# Patient Record
Sex: Male | Born: 1960 | Race: White | Hispanic: No | Marital: Married | Smoking: Current some day smoker
Health system: Southern US, Community
[De-identification: ages and names within clinical notes are randomized; demographics above are authoritative.]

## PROBLEM LIST (undated history)

## (undated) DIAGNOSIS — E785 Hyperlipidemia, unspecified: Secondary | ICD-10-CM

## (undated) DIAGNOSIS — I10 Essential (primary) hypertension: Secondary | ICD-10-CM

## (undated) DIAGNOSIS — F32A Depression, unspecified: Secondary | ICD-10-CM

## (undated) DIAGNOSIS — K5792 Diverticulitis of intestine, part unspecified, without perforation or abscess without bleeding: Secondary | ICD-10-CM

## (undated) DIAGNOSIS — F419 Anxiety disorder, unspecified: Secondary | ICD-10-CM

## (undated) DIAGNOSIS — R7303 Prediabetes: Secondary | ICD-10-CM

## (undated) DIAGNOSIS — B029 Zoster without complications: Secondary | ICD-10-CM

## (undated) DIAGNOSIS — M1990 Unspecified osteoarthritis, unspecified site: Secondary | ICD-10-CM

## (undated) DIAGNOSIS — N183 Chronic kidney disease, stage 3 unspecified: Secondary | ICD-10-CM

## (undated) DIAGNOSIS — K219 Gastro-esophageal reflux disease without esophagitis: Secondary | ICD-10-CM

## (undated) DIAGNOSIS — T7840XA Allergy, unspecified, initial encounter: Secondary | ICD-10-CM

## (undated) DIAGNOSIS — M5416 Radiculopathy, lumbar region: Secondary | ICD-10-CM

## (undated) DIAGNOSIS — G8929 Other chronic pain: Secondary | ICD-10-CM

## (undated) HISTORY — PX: BACK SURGERY: SHX140

## (undated) HISTORY — DX: Depression, unspecified: F32.A

## (undated) HISTORY — DX: Allergy, unspecified, initial encounter: T78.40XA

## (undated) HISTORY — PX: HERNIA REPAIR: SHX51

## (undated) HISTORY — DX: Gastro-esophageal reflux disease without esophagitis: K21.9

## (undated) HISTORY — DX: Unspecified osteoarthritis, unspecified site: M19.90

## (undated) HISTORY — PX: WISDOM TOOTH EXTRACTION: SHX21

## (undated) HISTORY — DX: Anxiety disorder, unspecified: F41.9

---

## 1984-07-05 DIAGNOSIS — H9319 Tinnitus, unspecified ear: Secondary | ICD-10-CM | POA: Insufficient documentation

## 2013-04-12 ENCOUNTER — Ambulatory Visit (HOSPITAL_COMMUNITY)
Admission: RE | Admit: 2013-04-12 | Discharge: 2013-04-12 | Disposition: A | Discharge status: Home / Self Care | Payer: Non-veteran care | Source: Ambulatory Visit | Attending: Internal Medicine | Admitting: Internal Medicine

## 2013-04-12 ENCOUNTER — Other Ambulatory Visit (HOSPITAL_COMMUNITY): Payer: Self-pay | Admitting: Internal Medicine

## 2013-04-12 DIAGNOSIS — M1990 Unspecified osteoarthritis, unspecified site: Secondary | ICD-10-CM

## 2013-04-12 DIAGNOSIS — M25469 Effusion, unspecified knee: Secondary | ICD-10-CM | POA: Insufficient documentation

## 2013-04-12 DIAGNOSIS — M25569 Pain in unspecified knee: Secondary | ICD-10-CM | POA: Insufficient documentation

## 2015-12-07 ENCOUNTER — Encounter (HOSPITAL_COMMUNITY): Payer: Self-pay | Admitting: Emergency Medicine

## 2015-12-07 ENCOUNTER — Emergency Department (HOSPITAL_COMMUNITY)
Admission: EM | Admit: 2015-12-07 | Discharge: 2015-12-07 | Disposition: A | Discharge status: Home / Self Care | Payer: Non-veteran care | Attending: Emergency Medicine | Admitting: Emergency Medicine

## 2015-12-07 DIAGNOSIS — I10 Essential (primary) hypertension: Secondary | ICD-10-CM | POA: Diagnosis not present

## 2015-12-07 DIAGNOSIS — Z791 Long term (current) use of non-steroidal anti-inflammatories (NSAID): Secondary | ICD-10-CM | POA: Insufficient documentation

## 2015-12-07 DIAGNOSIS — Z79899 Other long term (current) drug therapy: Secondary | ICD-10-CM | POA: Diagnosis not present

## 2015-12-07 DIAGNOSIS — R109 Unspecified abdominal pain: Secondary | ICD-10-CM | POA: Diagnosis not present

## 2015-12-07 DIAGNOSIS — N390 Urinary tract infection, site not specified: Secondary | ICD-10-CM | POA: Diagnosis not present

## 2015-12-07 DIAGNOSIS — M549 Dorsalgia, unspecified: Secondary | ICD-10-CM | POA: Diagnosis not present

## 2015-12-07 DIAGNOSIS — R300 Dysuria: Secondary | ICD-10-CM | POA: Diagnosis present

## 2015-12-07 HISTORY — DX: Essential (primary) hypertension: I10

## 2015-12-07 LAB — URINE MICROSCOPIC-ADD ON

## 2015-12-07 LAB — URINALYSIS, ROUTINE W REFLEX MICROSCOPIC
Bilirubin Urine: NEGATIVE
Glucose, UA: NEGATIVE mg/dL
Hgb urine dipstick: NEGATIVE
Ketones, ur: NEGATIVE mg/dL
Leukocytes, UA: NEGATIVE
Nitrite: POSITIVE — AB
Protein, ur: 30 mg/dL — AB
Specific Gravity, Urine: 1.01 (ref 1.005–1.030)
pH: 5.5 (ref 5.0–8.0)

## 2015-12-07 MED ORDER — HYDROCODONE-ACETAMINOPHEN 5-325 MG PO TABS: 1.0000 | ORAL_TABLET | Freq: Four times a day (QID) | ORAL | Status: DC | PRN

## 2015-12-07 MED ORDER — KETOROLAC TROMETHAMINE 60 MG/2ML IM SOLN
60.0000 mg | Freq: Once | INTRAMUSCULAR | Status: AC
  Administered 2015-12-07: 60 mg via INTRAMUSCULAR
  Filled 2015-12-07: qty 2

## 2015-12-07 MED ORDER — HYDROCODONE-ACETAMINOPHEN 5-325 MG PO TABS
2.0000 | ORAL_TABLET | Freq: Once | ORAL | Status: AC
  Administered 2015-12-07: 2 via ORAL
  Filled 2015-12-07: qty 2

## 2015-12-07 MED ORDER — ONDANSETRON 4 MG PO TBDP
4.0000 mg | ORAL_TABLET | Freq: Once | ORAL | Status: AC
  Administered 2015-12-07: 4 mg via ORAL
  Filled 2015-12-07: qty 1

## 2015-12-07 MED ORDER — ONDANSETRON 4 MG PO TBDP: 4.0000 mg | ORAL_TABLET | Freq: Three times a day (TID) | ORAL | Status: DC | PRN

## 2015-12-07 MED ORDER — CIPROFLOXACIN HCL 500 MG PO TABS: 500.0000 mg | ORAL_TABLET | Freq: Two times a day (BID) | ORAL | Status: DC

## 2015-12-07 MED ORDER — IBUPROFEN 800 MG PO TABS: 800.0000 mg | ORAL_TABLET | Freq: Three times a day (TID) | ORAL | Status: DC

## 2015-12-07 MED ORDER — CIPROFLOXACIN HCL 250 MG PO TABS
500.0000 mg | ORAL_TABLET | Freq: Once | ORAL | Status: AC
  Administered 2015-12-07: 500 mg via ORAL
  Filled 2015-12-07: qty 2

## 2015-12-07 NOTE — ED Notes (Signed)
Patient verbalizes understanding of discharge instructions prescriptions home care and follow up care. Patient out of department at this time.

## 2015-12-07 NOTE — ED Provider Notes (Signed)
CSN: [id number]     Arrival date & time 12/07/15  1638 History   First MD Initiated Contact with Patient 12/07/15 1719     Chief Complaint  Patient presents with  . Dysuria     (Consider location/radiation/quality/duration/timing/severity/associated sxs/prior Treatment) Patient is a 55 y.o. male presenting with dysuria.  Dysuria This is a new problem. The current episode started more than 2 days ago. The problem occurs constantly. Associated symptoms include abdominal pain (suprapubic). Pertinent negatives include no chest pain, no headaches and no shortness of breath. Nothing aggravates the symptoms. Nothing relieves the symptoms. He has tried nothing for the symptoms. The treatment provided no relief.    Past Medical History  Diagnosis Date  . Hypertension    Past Surgical History  Procedure Laterality Date  . Hernia repair     History reviewed. No pertinent family history. Social History  Substance Use Topics  . Smoking status: Never Smoker   . Smokeless tobacco: None  . Alcohol Use: No    Review of Systems  Constitutional: Negative for fever and chills.  Respiratory: Negative for shortness of breath.   Cardiovascular: Negative for chest pain.  Gastrointestinal: Positive for abdominal pain (suprapubic).  Genitourinary: Positive for dysuria.  Musculoskeletal: Positive for back pain (bilateral lwoer back).  Neurological: Negative for headaches.  All other systems reviewed and are negative.     Allergies  Sulfa antibiotics  Home Medications   Prior to Admission medications   Medication Sig Start Date End Date Taking? Authorizing Provider  diphenhydrAMINE (BENADRYL) 25 MG tablet Take 25 mg by mouth every 6 (six) hours as needed for allergies.   Yes Historical Provider, MD  lisinopril (PRINIVIL,ZESTRIL) 10 MG tablet Take 10 mg by mouth daily.   Yes Historical Provider, MD  ciprofloxacin (CIPRO) 500 MG tablet Take 1 tablet (500 mg total) by mouth 2 (two) times daily.  12/07/15   Marily MemosJason Lateshia Schmoker, MD  HYDROcodone-acetaminophen (NORCO/VICODIN) 5-325 MG tablet Take 1 tablet by mouth every 6 (six) hours as needed for severe pain. 12/07/15   Marily MemosJason Shankar Silber, MD  ibuprofen (ADVIL,MOTRIN) 800 MG tablet Take 1 tablet (800 mg total) by mouth 3 (three) times daily. 12/07/15   Marily MemosJason Kamiryn Bezanson, MD  ondansetron (ZOFRAN-ODT) 4 MG disintegrating tablet Take 1 tablet (4 mg total) by mouth every 8 (eight) hours as needed for nausea or vomiting. 12/07/15   Marily MemosJason Catherine Cubero, MD   BP 143/92 mmHg  Pulse 77  Temp(Src) 98 F (36.7 C) (Oral)  Resp 18  Ht 5\' 11"  (1.803 m)  Wt 195 lb (88.451 kg)  BMI 27.21 kg/m2  SpO2 98% Physical Exam  Constitutional: He appears well-developed and well-nourished.  HENT:  Head: Normocephalic and atraumatic.  Neck: Normal range of motion.  Cardiovascular: Normal rate.   Pulmonary/Chest: Effort normal. No respiratory distress. He has no wheezes.  Abdominal: He exhibits no distension. There is tenderness (suprapubic). There is no rebound and no guarding.  Musculoskeletal: Normal range of motion. He exhibits no edema or tenderness.  Neurological: He is alert.  Skin: Skin is warm and dry. No rash noted.  Nursing note and vitals reviewed.   ED Course  Procedures (including critical care time) Labs Review Labs Reviewed  URINALYSIS, ROUTINE W REFLEX MICROSCOPIC (NOT [facility]) - Abnormal; Notable for the following:    Color, Urine AMBER (*)    Protein, ur 30 (*)    Nitrite POSITIVE (*)    All other components within normal limits  URINE MICROSCOPIC-ADD ON - Abnormal; Notable  for the following:    Squamous Epithelial / LPF 0-5 (*)    Bacteria, UA RARE (*)    All other components within normal limits  URINE CULTURE    Imaging Review No results found. I have personally reviewed and evaluated these images and lab results as part of my medical decision-making.   EKG Interpretation None      MDM   Final diagnoses:  UTI (lower urinary tract infection)     UA with likely UTI. Culture added. Suspect early pyelo based on back pain and nausea. No e/o sepsis to need further workup. Will dc on cipro/zofran.   New Prescriptions: Discharge Medication List as of 12/07/2015  5:55 PM    START taking these medications   Details  ciprofloxacin (CIPRO) 500 MG tablet Take 1 tablet (500 mg total) by mouth 2 (two) times daily., Starting 12/07/2015, Until Discontinued, Print    HYDROcodone-acetaminophen (NORCO/VICODIN) 5-325 MG tablet Take 1 tablet by mouth every 6 (six) hours as needed for severe pain., Starting 12/07/2015, Until Discontinued, Print    ondansetron (ZOFRAN-ODT) 4 MG disintegrating tablet Take 1 tablet (4 mg total) by mouth every 8 (eight) hours as needed for nausea or vomiting., Starting 12/07/2015, Until Discontinued, Print        I have personally and contemperaneously reviewed labs and imaging and used in my decision making as above.   A medical screening exam was performed and I feel the patient has had an appropriate workup for their chief complaint at this time and likelihood of emergent condition existing is low and thus workup can continue on an outpatient basis.. Their vital signs are stable. They have been counseled on decision, discharge, follow up and which symptoms necessitate immediate return to the emergency department.  They verbally stated understanding and agreement with plan and discharged in stable condition.      Marily Memos, MD 12/07/15 [phone]

## 2015-12-07 NOTE — ED Notes (Signed)
Pt states he has been having a lot of pain in his bladder for the past few days with burning with urination.

## 2015-12-09 LAB — URINE CULTURE: Culture: NO GROWTH

## 2017-06-06 ENCOUNTER — Encounter (HOSPITAL_COMMUNITY): Payer: Self-pay | Admitting: Cardiology

## 2017-06-06 ENCOUNTER — Emergency Department (HOSPITAL_COMMUNITY)
Admission: EM | Admit: 2017-06-06 | Discharge: 2017-06-06 | Disposition: A | Discharge status: Home / Self Care | Payer: Non-veteran care | Attending: Emergency Medicine | Admitting: Emergency Medicine

## 2017-06-06 ENCOUNTER — Emergency Department (HOSPITAL_COMMUNITY): Payer: Non-veteran care

## 2017-06-06 DIAGNOSIS — K5732 Diverticulitis of large intestine without perforation or abscess without bleeding: Secondary | ICD-10-CM | POA: Diagnosis not present

## 2017-06-06 DIAGNOSIS — I10 Essential (primary) hypertension: Secondary | ICD-10-CM | POA: Diagnosis not present

## 2017-06-06 DIAGNOSIS — Z79899 Other long term (current) drug therapy: Secondary | ICD-10-CM | POA: Insufficient documentation

## 2017-06-06 DIAGNOSIS — R1032 Left lower quadrant pain: Secondary | ICD-10-CM | POA: Diagnosis present

## 2017-06-06 HISTORY — DX: Diverticulitis of intestine, part unspecified, without perforation or abscess without bleeding: K57.92

## 2017-06-06 LAB — DIFFERENTIAL
BASOS PCT: 0 %
Basophils Absolute: 0 10*3/uL (ref 0.0–0.1)
Eosinophils Absolute: 0.1 10*3/uL (ref 0.0–0.7)
Eosinophils Relative: 1 %
Lymphocytes Relative: 17 %
Lymphs Abs: 2.2 10*3/uL (ref 0.7–4.0)
Monocytes Absolute: 1.1 10*3/uL — ABNORMAL HIGH (ref 0.1–1.0)
Monocytes Relative: 9 %
NEUTROS ABS: 9.1 10*3/uL — AB (ref 1.7–7.7)
NEUTROS PCT: 73 %

## 2017-06-06 LAB — URINALYSIS, ROUTINE W REFLEX MICROSCOPIC
Bilirubin Urine: NEGATIVE
Glucose, UA: NEGATIVE mg/dL
Hgb urine dipstick: NEGATIVE
KETONES UR: NEGATIVE mg/dL
LEUKOCYTES UA: NEGATIVE
NITRITE: NEGATIVE
PROTEIN: NEGATIVE mg/dL
Specific Gravity, Urine: 1.009 (ref 1.005–1.030)
pH: 6 (ref 5.0–8.0)

## 2017-06-06 LAB — CBC
HEMATOCRIT: 44.8 % (ref 39.0–52.0)
Hemoglobin: 15.2 g/dL (ref 13.0–17.0)
MCH: 31.7 pg (ref 26.0–34.0)
MCHC: 33.9 g/dL (ref 30.0–36.0)
MCV: 93.5 fL (ref 78.0–100.0)
Platelets: 312 10*3/uL (ref 150–400)
RBC: 4.79 MIL/uL (ref 4.22–5.81)
RDW: 12.9 % (ref 11.5–15.5)
WBC: 12.7 10*3/uL — ABNORMAL HIGH (ref 4.0–10.5)

## 2017-06-06 LAB — COMPREHENSIVE METABOLIC PANEL
ALBUMIN: 4.7 g/dL (ref 3.5–5.0)
ALT: 41 U/L (ref 17–63)
AST: 33 U/L (ref 15–41)
Alkaline Phosphatase: 50 U/L (ref 38–126)
Anion gap: 11 (ref 5–15)
BUN: 9 mg/dL (ref 6–20)
CO2: 24 mmol/L (ref 22–32)
CREATININE: 1.11 mg/dL (ref 0.61–1.24)
Calcium: 10.1 mg/dL (ref 8.9–10.3)
Chloride: 105 mmol/L (ref 101–111)
GFR calc Af Amer: >60 mL/min (ref >60)
GFR calc non Af Amer: >60 mL/min (ref >60)
Glucose, Bld: 105 mg/dL — ABNORMAL HIGH (ref 65–99)
POTASSIUM: 4.6 mmol/L (ref 3.5–5.1)
SODIUM: 140 mmol/L (ref 135–145)
TOTAL PROTEIN: 7.8 g/dL (ref 6.5–8.1)
Total Bilirubin: 0.8 mg/dL (ref 0.3–1.2)

## 2017-06-06 LAB — LIPASE, BLOOD: LIPASE: 29 U/L (ref 11–51)

## 2017-06-06 MED ORDER — OXYCODONE-ACETAMINOPHEN 5-325 MG PO TABS
2.0000 | ORAL_TABLET | Freq: Once | ORAL | Status: AC
  Administered 2017-06-06: 2 via ORAL
  Filled 2017-06-06: qty 2

## 2017-06-06 MED ORDER — METRONIDAZOLE 500 MG PO TABS: 500.0000 mg | ORAL_TABLET | Freq: Three times a day (TID) | ORAL | 0 refills | Status: DC

## 2017-06-06 MED ORDER — CIPROFLOXACIN HCL 500 MG PO TABS: 500.0000 mg | ORAL_TABLET | Freq: Two times a day (BID) | ORAL | 0 refills | Status: DC

## 2017-06-06 MED ORDER — METRONIDAZOLE IN NACL 5-0.79 MG/ML-% IV SOLN
500.0000 mg | Freq: Once | INTRAVENOUS | Status: AC
  Administered 2017-06-06: 500 mg via INTRAVENOUS
  Filled 2017-06-06: qty 100

## 2017-06-06 MED ORDER — OXYCODONE-ACETAMINOPHEN 5-325 MG PO TABS: ORAL_TABLET | ORAL | 0 refills | Status: DC

## 2017-06-06 MED ORDER — MORPHINE SULFATE (PF) 4 MG/ML IV SOLN
4.0000 mg | INTRAVENOUS | Status: AC | PRN
  Administered 2017-06-06 (×2): 4 mg via INTRAVENOUS
  Filled 2017-06-06 (×2): qty 1

## 2017-06-06 MED ORDER — CIPROFLOXACIN IN D5W 400 MG/200ML IV SOLN
400.0000 mg | Freq: Once | INTRAVENOUS | Status: AC
  Administered 2017-06-06: 400 mg via INTRAVENOUS
  Filled 2017-06-06: qty 200

## 2017-06-06 MED ORDER — PROMETHAZINE HCL 25 MG PO TABS: 25.0000 mg | ORAL_TABLET | Freq: Four times a day (QID) | ORAL | 0 refills | Status: DC | PRN

## 2017-06-06 MED ORDER — SODIUM CHLORIDE 0.9 % IV SOLN
INTRAVENOUS | Status: DC
  Administered 2017-06-06: 19:00:00 via INTRAVENOUS

## 2017-06-06 MED ORDER — IOPAMIDOL (ISOVUE-300) INJECTION 61%
100.0000 mL | Freq: Once | INTRAVENOUS | Status: AC | PRN
  Administered 2017-06-06: 100 mL via INTRAVENOUS

## 2017-06-06 MED ORDER — ONDANSETRON HCL 4 MG/2ML IJ SOLN
4.0000 mg | INTRAMUSCULAR | Status: DC | PRN
  Administered 2017-06-06: 4 mg via INTRAVENOUS
  Filled 2017-06-06: qty 2

## 2017-06-06 NOTE — Discharge Instructions (Signed)
Take the prescriptions as directed.  Eat a clear liquid diet for the next few days, then advance to a soft diet as slowly as you can tolerate it. Call your regular medical doctor tomorrow to schedule a follow up appointment in the next 2 days. Call the GI doctor tomorrow to schedule a follow up appointment within the next week.  Return to the Emergency Department immediately if not improving (or even worsening) despite taking the medicines as prescribed, any black or bloody stool or vomit, if you develop a fever over "101," or for any other concerns.

## 2017-06-06 NOTE — ED Triage Notes (Signed)
Left sided abdominal pain since last night.  C/O nausea.

## 2017-06-06 NOTE — ED Provider Notes (Signed)
[facility] Provider Note   CSN: [id number] Arrival date & time: 06/06/17  1357     History   Chief Complaint Chief Complaint  Patient presents with  . Abdominal Pain    HPI Thalia PartyCharles Buffone is a 56 y.o. male.   Abdominal Pain      Pt was seen at 1825.  Per pt, c/o gradual onset and persistence of constant LLQ abd "pain" for the past 2 days.  Has been associated with nausea and multiple intermittent episodes of diarrhea.  Describes the abd pain as "like the last time I had diverticulitis."  Denies vomiting, no fevers, no back pain, no rash, no CP/SOB, no black or blood in stools.      Past Medical History:  Diagnosis Date  . Diverticulitis   . Hypertension     There are no active problems to display for this patient.   Past Surgical History:  Procedure Laterality Date  . HERNIA REPAIR         Home Medications    Prior to Admission medications   Medication Sig Start Date End Date Taking? Authorizing Provider  ciprofloxacin (CIPRO) 500 MG tablet Take 1 tablet (500 mg total) by mouth 2 (two) times daily. 12/07/15   Mesner, Barbara CowerJason, MD  diphenhydrAMINE (BENADRYL) 25 MG tablet Take 25 mg by mouth every 6 (six) hours as needed for allergies.    [provider]  HYDROcodone-acetaminophen (NORCO/VICODIN) 5-325 MG tablet Take 1 tablet by mouth every 6 (six) hours as needed for severe pain. 12/07/15   Mesner, Barbara CowerJason, MD  ibuprofen (ADVIL,MOTRIN) 800 MG tablet Take 1 tablet (800 mg total) by mouth 3 (three) times daily. 12/07/15   Mesner, Barbara CowerJason, MD  lisinopril (PRINIVIL,ZESTRIL) 10 MG tablet Take 10 mg by mouth daily.    [provider]  ondansetron (ZOFRAN-ODT) 4 MG disintegrating tablet Take 1 tablet (4 mg total) by mouth every 8 (eight) hours as needed for nausea or vomiting. 12/07/15   Mesner, Barbara CowerJason, MD    Family History History reviewed. No pertinent family history.  Social History Social History   Tobacco Use  . Smoking status: Never  Smoker  Substance Use Topics  . Alcohol use: No  . Drug use: No     Allergies   Sulfa antibiotics   Review of Systems Review of Systems  Gastrointestinal: Positive for abdominal pain.  ROS: Statement: All systems negative except as marked or noted in the HPI; Constitutional: Negative for fever and chills. ; ; Eyes: Negative for eye pain, redness and discharge. ; ; ENMT: Negative for ear pain, hoarseness, nasal congestion, sinus pressure and sore throat. ; ; Cardiovascular: Negative for chest pain, palpitations, diaphoresis, dyspnea and peripheral edema. ; ; Respiratory: Negative for cough, wheezing and stridor. ; ; Gastrointestinal: +nausea, abd pain. Negative for vomiting, blood in stool, hematemesis, jaundice and rectal bleeding. . ; ; Genitourinary: Negative for dysuria, flank pain and hematuria. ; ; Musculoskeletal: Negative for back pain and neck pain. Negative for swelling and trauma.; ; Skin: Negative for pruritus, rash, abrasions, blisters, bruising and skin lesion.; ; Neuro: Negative for headache, lightheadedness and neck stiffness. Negative for weakness, altered level of consciousness, altered mental status, extremity weakness, paresthesias, involuntary movement, seizure and syncope.        Physical Exam Updated Vital Signs BP 106/81 (BP Location: Right Arm)   Pulse (!) 119   Temp 98.2 F (36.8 C) (Oral)   Resp 18   Ht 5\' 11"  (1.803 m)  Wt 88.5 kg (195 lb)   SpO2 94%   BMI 27.20 kg/m   Physical Exam 1830: Physical examination:  Nursing notes reviewed; Vital signs and O2 SAT reviewed;  Constitutional: Well developed, Well nourished, Well hydrated, Uncomfortable appearing.; Head:  Normocephalic, atraumatic; Eyes: EOMI, PERRL, No scleral icterus; ENMT: Mouth and pharynx normal, Mucous membranes moist; Neck: Supple, Full range of motion, No lymphadenopathy; Cardiovascular: Tachycardic rate and rhythm, No gallop; Respiratory: Breath sounds clear & equal bilaterally, No  wheezes.  Speaking full sentences with ease, Normal respiratory effort/excursion; Chest: Nontender, Movement normal; Abdomen: Soft, +LLQ > diffuse tenderness to palp. Nondistended, Normal bowel sounds; Genitourinary: No CVA tenderness; Extremities: Pulses normal, No tenderness, No edema, No calf edema or asymmetry.; Neuro: AA&Ox3, Major CN grossly intact.  Speech clear. No gross focal motor or sensory deficits in extremities.; Skin: Color normal, Warm, Dry.   ED Treatments / Results  Labs (all labs ordered are listed, but only abnormal results are displayed)   EKG  EKG Interpretation None       Radiology    Procedures Procedures (including critical care time)  Medications Ordered in ED Medications  morphine 4 MG/ML injection 4 mg (not administered)  ondansetron (ZOFRAN) injection 4 mg (not administered)     Initial Impression / Assessment and Plan / ED Course  I have reviewed the triage vital signs and the nursing notes.  Pertinent labs & imaging results that were available during my care of the patient were reviewed by me and considered in my medical decision making (see chart for details).  MDM Reviewed: nursing note and vitals Interpretation: labs and CT scan   Results for orders placed or performed during the hospital encounter of 06/06/17  Lipase, blood  Result Value Ref Range   Lipase 29 11 - 51 U/L  Comprehensive metabolic panel  Result Value Ref Range   Sodium 140 135 - 145 mmol/L   Potassium 4.6 3.5 - 5.1 mmol/L   Chloride 105 101 - 111 mmol/L   CO2 24 22 - 32 mmol/L   Glucose, Bld 105 (H) 65 - 99 mg/dL   BUN 9 6 - 20 mg/dL   Creatinine, Ser 1.61 0.61 - 1.24 mg/dL   Calcium 09.6 8.9 - 04.5 mg/dL   Total Protein 7.8 6.5 - 8.1 g/dL   Albumin 4.7 3.5 - 5.0 g/dL   AST 33 15 - 41 U/L   ALT 41 17 - 63 U/L   Alkaline Phosphatase 50 38 - 126 U/L   Total Bilirubin 0.8 0.3 - 1.2 mg/dL   GFR calc non Af Amer >60 >60 mL/min   GFR calc Af Amer >60 >60 mL/min    Anion gap 11 5 - 15  CBC  Result Value Ref Range   WBC 12.7 (H) 4.0 - 10.5 K/uL   RBC 4.79 4.22 - 5.81 MIL/uL   Hemoglobin 15.2 13.0 - 17.0 g/dL   HCT 40.9 81.1 - 91.4 %   MCV 93.5 78.0 - 100.0 fL   MCH 31.7 26.0 - 34.0 pg   MCHC 33.9 30.0 - 36.0 g/dL   RDW 78.2 95.6 - 21.3 %   Platelets 312 150 - 400 K/uL  Urinalysis, Routine w reflex microscopic  Result Value Ref Range   Color, Urine YELLOW YELLOW   APPearance CLEAR CLEAR   Specific Gravity, Urine 1.009 1.005 - 1.030   pH 6.0 5.0 - 8.0   Glucose, UA NEGATIVE NEGATIVE mg/dL   Hgb urine dipstick NEGATIVE NEGATIVE   Bilirubin  Urine NEGATIVE NEGATIVE   Ketones, ur NEGATIVE NEGATIVE mg/dL   Protein, ur NEGATIVE NEGATIVE mg/dL   Nitrite NEGATIVE NEGATIVE   Leukocytes, UA NEGATIVE NEGATIVE  Differential  Result Value Ref Range   Neutrophils Relative % 73 %   Neutro Abs 9.1 (H) 1.7 - 7.7 K/uL   Lymphocytes Relative 17 %   Lymphs Abs 2.2 0.7 - 4.0 K/uL   Monocytes Relative 9 %   Monocytes Absolute 1.1 (H) 0.1 - 1.0 K/uL   Eosinophils Relative 1 %   Eosinophils Absolute 0.1 0.0 - 0.7 K/uL   Basophils Relative 0 %   Basophils Absolute 0.0 0.0 - 0.1 K/uL   Ct Abdomen Pelvis W Contrast Result Date: 06/06/2017 CLINICAL DATA:  Left-sided abdominal pain since last night. Nausea. History of diverticulitis. EXAM: CT ABDOMEN AND PELVIS WITH CONTRAST TECHNIQUE: Multidetector CT imaging of the abdomen and pelvis was performed using the standard protocol following bolus administration of intravenous contrast. CONTRAST:  100mL ISOVUE-300 IOPAMIDOL (ISOVUE-300) INJECTION 61% COMPARISON:  None. FINDINGS: Lower chest: No acute abnormality. Hepatobiliary: No focal liver abnormality is seen. No gallstones, gallbladder wall thickening, or biliary dilatation. Pancreas: Unremarkable. No pancreatic ductal dilatation or surrounding inflammatory changes. Spleen: Normal in size without focal abnormality. Adrenals/Urinary Tract: 1.8 cm indeterminate right  adrenal gland circumscribed soft tissue mass. Normal appearance of the left adrenal gland. Normal appearance of the right kidney. 1.5 cm left renal cyst. No evidence of hydronephrosis or nephrolithiasis. Stomach/Bowel: Normal appearance of the stomach, small bowel and appendix. Left colonic diverticulosis. Mild asymmetric mucosal thickening, pericolonic inflammatory changes associated with prominent left proximal sigmoid colon diverticula, image 64 to 69/101, sequence 2. No evidence of abscess formation one macro rupture. Vascular/Lymphatic: No significant vascular findings are present. No enlarged abdominal or pelvic lymph nodes. Reproductive: Mild prominence of the prostate gland. Other: No abdominal wall hernia or abnormality. No abdominopelvic ascites. Musculoskeletal: L5-S1 osteoarthritic changes. IMPRESSION: Relatively short segment of mucosal thickening with pericolonic inflammatory changes on the background of extensive diverticulosis of the sigmoid colon. Most likely diagnosis is acute diverticulitis. Underlying malignancy cannot be entirely excluded, and therefore correlation to colonoscopy after resolution of the acute symptoms may be considered. 1.8 cm indeterminate right adrenal gland mass. Statistically, this likely represents an adrenal adenoma. Further evaluation with MRI or dedicated adrenal protocol CT of the abdomen may be considered if imaging evaluation is desired. Electronically Signed   By: Ted Mcalpineobrinka  Dimitrova M.D.   On: 06/06/2017 20:03    2230:  Pt has tol PO well while in the ED without N/V.  No stooling while in the ED. VSS. Pt feels better and wants to go home now. Offered admission; pt declines. Strict return precautions given; pt verb understanding. Dx and testing d/w pt.  Questions answered.  Verb understanding, agreeable to d/c home with outpt f/u.       Final Clinical Impressions(s) / ED Diagnoses   Final diagnoses:  None    ED Discharge Orders    None          Samuel JesterMcManus, Dawnetta Copenhaver, DO 06/10/17 [id number]

## 2018-04-08 ENCOUNTER — Encounter (HOSPITAL_COMMUNITY): Payer: Self-pay | Admitting: Emergency Medicine

## 2018-04-08 ENCOUNTER — Other Ambulatory Visit: Payer: Self-pay

## 2018-04-08 ENCOUNTER — Emergency Department (HOSPITAL_COMMUNITY): Payer: No Typology Code available for payment source

## 2018-04-08 ENCOUNTER — Emergency Department (HOSPITAL_COMMUNITY)
Admission: EM | Admit: 2018-04-08 | Discharge: 2018-04-09 | Disposition: A | Discharge status: Home / Self Care | Payer: No Typology Code available for payment source | Attending: Emergency Medicine | Admitting: Emergency Medicine

## 2018-04-08 DIAGNOSIS — K5792 Diverticulitis of intestine, part unspecified, without perforation or abscess without bleeding: Secondary | ICD-10-CM | POA: Diagnosis not present

## 2018-04-08 DIAGNOSIS — I10 Essential (primary) hypertension: Secondary | ICD-10-CM | POA: Diagnosis not present

## 2018-04-08 DIAGNOSIS — Z79899 Other long term (current) drug therapy: Secondary | ICD-10-CM | POA: Diagnosis not present

## 2018-04-08 DIAGNOSIS — R109 Unspecified abdominal pain: Secondary | ICD-10-CM | POA: Diagnosis present

## 2018-04-08 LAB — CBC
HCT: 43.5 % (ref 39.0–52.0)
HEMOGLOBIN: 15.3 g/dL (ref 13.0–17.0)
MCH: 32.9 pg (ref 26.0–34.0)
MCHC: 35.2 g/dL (ref 30.0–36.0)
MCV: 93.5 fL (ref 78.0–100.0)
PLATELETS: 288 10*3/uL (ref 150–400)
RBC: 4.65 MIL/uL (ref 4.22–5.81)
RDW: 13 % (ref 11.5–15.5)
WBC: 15.4 10*3/uL — ABNORMAL HIGH (ref 4.0–10.5)

## 2018-04-08 LAB — COMPREHENSIVE METABOLIC PANEL
ALBUMIN: 4.2 g/dL (ref 3.5–5.0)
ALK PHOS: 46 U/L (ref 38–126)
ALT: 23 U/L (ref 0–44)
AST: 26 U/L (ref 15–41)
Anion gap: 9 (ref 5–15)
BILIRUBIN TOTAL: 0.7 mg/dL (ref 0.3–1.2)
BUN: 14 mg/dL (ref 6–20)
CALCIUM: 9.2 mg/dL (ref 8.9–10.3)
CO2: 21 mmol/L — ABNORMAL LOW (ref 22–32)
Chloride: 109 mmol/L (ref 98–111)
Creatinine, Ser: 1.15 mg/dL (ref 0.61–1.24)
GFR calc Af Amer: >60 mL/min (ref >60)
GFR calc non Af Amer: >60 mL/min (ref >60)
GLUCOSE: 117 mg/dL — AB (ref 70–99)
Potassium: 3.8 mmol/L (ref 3.5–5.1)
Sodium: 139 mmol/L (ref 135–145)
TOTAL PROTEIN: 7.7 g/dL (ref 6.5–8.1)

## 2018-04-08 LAB — URINALYSIS, ROUTINE W REFLEX MICROSCOPIC
Bacteria, UA: NONE SEEN
Bilirubin Urine: NEGATIVE
GLUCOSE, UA: NEGATIVE mg/dL
HGB URINE DIPSTICK: NEGATIVE
Ketones, ur: NEGATIVE mg/dL
NITRITE: NEGATIVE
PH: 5 (ref 5.0–8.0)
Protein, ur: NEGATIVE mg/dL
Specific Gravity, Urine: 1.017 (ref 1.005–1.030)

## 2018-04-08 LAB — LIPASE, BLOOD: Lipase: 30 U/L (ref 11–51)

## 2018-04-08 MED ORDER — HYDROCODONE-ACETAMINOPHEN 5-325 MG PO TABS: 1.0000 | ORAL_TABLET | Freq: Four times a day (QID) | ORAL | 0 refills | Status: DC | PRN

## 2018-04-08 MED ORDER — SODIUM CHLORIDE 0.9 % IV BOLUS
1000.0000 mL | Freq: Once | INTRAVENOUS | Status: AC
  Administered 2018-04-08: 1000 mL via INTRAVENOUS

## 2018-04-08 MED ORDER — HYDROMORPHONE HCL 1 MG/ML IJ SOLN
1.0000 mg | Freq: Once | INTRAMUSCULAR | Status: AC
  Administered 2018-04-08: 1 mg via INTRAVENOUS
  Filled 2018-04-08: qty 1

## 2018-04-08 MED ORDER — ONDANSETRON 8 MG PO TBDP: 8.0000 mg | ORAL_TABLET | Freq: Three times a day (TID) | ORAL | 0 refills | Status: DC | PRN

## 2018-04-08 MED ORDER — ONDANSETRON HCL 4 MG/2ML IJ SOLN
4.0000 mg | Freq: Once | INTRAMUSCULAR | Status: AC
  Administered 2018-04-08: 4 mg via INTRAVENOUS
  Filled 2018-04-08: qty 2

## 2018-04-08 MED ORDER — METRONIDAZOLE IN NACL 5-0.79 MG/ML-% IV SOLN
500.0000 mg | Freq: Once | INTRAVENOUS | Status: AC
  Administered 2018-04-09: 500 mg via INTRAVENOUS
  Filled 2018-04-08: qty 100

## 2018-04-08 MED ORDER — IOPAMIDOL (ISOVUE-300) INJECTION 61%
100.0000 mL | Freq: Once | INTRAVENOUS | Status: AC | PRN
  Administered 2018-04-08: 100 mL via INTRAVENOUS

## 2018-04-08 MED ORDER — CIPROFLOXACIN IN D5W 400 MG/200ML IV SOLN
400.0000 mg | Freq: Once | INTRAVENOUS | Status: AC
  Administered 2018-04-09: 400 mg via INTRAVENOUS
  Filled 2018-04-08: qty 200

## 2018-04-08 NOTE — ED Triage Notes (Signed)
Pt thinks his diverticulitis is "flaring up again". Pt states he was seen at Urgent Care on Friday and started on antibiotics (Ciopro and Flagyl) but pain is "worse" and he is noticing some blood in his stool.

## 2018-04-08 NOTE — Discharge Instructions (Signed)
It was our pleasure to provide your ER care today - we hope that you feel better.  Complete the full course of your antibiotics.   You may take hydrocodone as need for pain. No driving for the next 6 hours or when taking hydrocodone. Also, do not take tylenol or acetaminophen containing medication when taking hydrocodone. Take zofran as need for nausea.   Follow up with primary care doctor in the next couple of days for recheck.  Return to ER if worse, new symptoms, worsening or intractable pain, persistent vomiting, other concern.

## 2018-04-08 NOTE — ED Provider Notes (Signed)
[facility] EMERGENCY DEPARTMENT Provider Note   CSN: [id number] Arrival date & time: 04/08/18  1942     History   Chief Complaint Chief Complaint  Patient presents with  . Abdominal Pain    HPI John Jordan is a 57 y.o. male.  Patient c/o left lower abd pain for 3-4 days. Pain gradual onset, progressively worse, mod-severe, non radiating, worse w palpation. Similar to prior pain w diverticultiis, but has beeen on po abx for 2 days and pain worse. Fever to 100.5. Nausea. No vomiting. No dysuria or gu c/o. No scrotal or testicular pain.   The history is provided by the patient.  Abdominal Pain   Associated symptoms include fever. Pertinent negatives include vomiting, dysuria and headaches.    Past Medical History:  Diagnosis Date  . Diverticulitis   . Hypertension     There are no active problems to display for this patient.   Past Surgical History:  Procedure Laterality Date  . HERNIA REPAIR          Home Medications    Prior to Admission medications   Medication Sig Start Date End Date Taking? Authorizing Provider  ciprofloxacin (CIPRO) 500 MG tablet Take 1 tablet (500 mg total) by mouth 2 (two) times daily. 06/06/17   Samuel Jester, DO  docusate sodium (COLACE) 100 MG capsule Take 100 mg by mouth daily.    [provider]  lisinopril (PRINIVIL,ZESTRIL) 10 MG tablet Take 10 mg by mouth daily.    [provider]  metroNIDAZOLE (FLAGYL) 500 MG tablet Take 1 tablet (500 mg total) by mouth 3 (three) times daily. 06/06/17   Samuel Jester, DO  oxyCODONE-acetaminophen (PERCOCET/ROXICET) 5-325 MG tablet 1 or 2 tabs PO q8h prn pain 06/06/17   Samuel Jester, DO  promethazine (PHENERGAN) 25 MG tablet Take 1 tablet (25 mg total) by mouth every 6 (six) hours as needed for nausea or vomiting. 06/06/17   Samuel Jester, DO    Family History No family history on file.  Social History Social History   Tobacco Use  . Smoking status: Never  Smoker  . Smokeless tobacco: Never Used  Substance Use Topics  . Alcohol use: No  . Drug use: No     Allergies   Nsaids and Sulfa antibiotics   Review of Systems Review of Systems  Constitutional: Positive for fever.  HENT: Negative for sore throat.   Eyes: Negative for redness.  Respiratory: Negative for shortness of breath.   Cardiovascular: Negative for chest pain.  Gastrointestinal: Positive for abdominal pain. Negative for vomiting.  Genitourinary: Negative for dysuria.  Musculoskeletal: Negative for back pain.  Skin: Negative for rash.  Neurological: Negative for headaches.  Hematological: Does not bruise/bleed easily.  Psychiatric/Behavioral: Negative for confusion.     Physical Exam Updated Vital Signs BP 134/88 (BP Location: Right Arm)   Pulse (!) 115   Temp 99.5 F (37.5 C) (Temporal)   Resp 18   Ht 1.803 m (5\' 11" )   Wt 90.7 kg   SpO2 98%   BMI 27.89 kg/m   Physical Exam  Constitutional: He appears well-developed and well-nourished.  HENT:  Mouth/Throat: Oropharynx is clear and moist.  Eyes: Conjunctivae are normal.  Neck: Neck supple. No tracheal deviation present.  Cardiovascular: Normal rate, regular rhythm, normal heart sounds and intact distal pulses. Exam reveals no gallop and no friction rub.  No murmur heard. Pulmonary/Chest: Effort normal and breath sounds normal. No accessory muscle usage. No respiratory distress.  Abdominal: Soft. Bowel  sounds are normal. He exhibits no distension and no mass. There is tenderness. There is no rebound and no guarding. No hernia.  Mod-sev LLQ tenderness.   Genitourinary:  Genitourinary Comments: Normal external gu exam, no scrotal/testicular pain, swelling or tenderness.   Musculoskeletal: He exhibits no edema.  Neurological: He is alert.  Skin: Skin is warm and dry. No rash noted.  Psychiatric: He has a normal mood and affect.  Nursing note and vitals reviewed.    ED Treatments / Results  Labs (all  labs ordered are listed, but only abnormal results are displayed) Results for orders placed or performed during the hospital encounter of 04/08/18  Lipase, blood  Result Value Ref Range   Lipase 30 11 - 51 U/L  Comprehensive metabolic panel  Result Value Ref Range   Sodium 139 135 - 145 mmol/L   Potassium 3.8 3.5 - 5.1 mmol/L   Chloride 109 98 - 111 mmol/L   CO2 21 (L) 22 - 32 mmol/L   Glucose, Bld 117 (H) 70 - 99 mg/dL   BUN 14 6 - 20 mg/dL   Creatinine, Ser 1.61 0.61 - 1.24 mg/dL   Calcium 9.2 8.9 - 09.6 mg/dL   Total Protein 7.7 6.5 - 8.1 g/dL   Albumin 4.2 3.5 - 5.0 g/dL   AST 26 15 - 41 U/L   ALT 23 0 - 44 U/L   Alkaline Phosphatase 46 38 - 126 U/L   Total Bilirubin 0.7 0.3 - 1.2 mg/dL   GFR calc non Af Amer >60 >60 mL/min   GFR calc Af Amer >60 >60 mL/min   Anion gap 9 5 - 15  CBC  Result Value Ref Range   WBC 15.4 (H) 4.0 - 10.5 K/uL   RBC 4.65 4.22 - 5.81 MIL/uL   Hemoglobin 15.3 13.0 - 17.0 g/dL   HCT 04.5 40.9 - 81.1 %   MCV 93.5 78.0 - 100.0 fL   MCH 32.9 26.0 - 34.0 pg   MCHC 35.2 30.0 - 36.0 g/dL   RDW 91.4 78.2 - 95.6 %   Platelets 288 150 - 400 K/uL  Urinalysis, Routine w reflex microscopic  Result Value Ref Range   Color, Urine YELLOW YELLOW   APPearance CLEAR CLEAR   Specific Gravity, Urine 1.017 1.005 - 1.030   pH 5.0 5.0 - 8.0   Glucose, UA NEGATIVE NEGATIVE mg/dL   Hgb urine dipstick NEGATIVE NEGATIVE   Bilirubin Urine NEGATIVE NEGATIVE   Ketones, ur NEGATIVE NEGATIVE mg/dL   Protein, ur NEGATIVE NEGATIVE mg/dL   Nitrite NEGATIVE NEGATIVE   Leukocytes, UA TRACE (A) NEGATIVE   RBC / HPF 0-5 0 - 5 RBC/hpf   WBC, UA 0-5 0 - 5 WBC/hpf   Bacteria, UA NONE SEEN NONE SEEN   Squamous Epithelial / LPF 0-5 0 - 5   Mucus PRESENT     EKG None  Radiology Ct Abdomen Pelvis W Contrast  Result Date: 04/08/2018 CLINICAL DATA:  Abdominal pain.  Diverticulitis suspected. EXAM: CT ABDOMEN AND PELVIS WITH CONTRAST TECHNIQUE: Multidetector CT imaging of the  abdomen and pelvis was performed using the standard protocol following bolus administration of intravenous contrast. CONTRAST:  ISOVUE-300 IOPAMIDOL (ISOVUE-300) INJECTION 61% COMPARISON:  CT 06/06/2017 FINDINGS: Lower chest: Linear atelectasis in both lower lobes. No pleural fluid. Hepatobiliary: No focal liver abnormality is seen. No gallstones, gallbladder wall thickening, or biliary dilatation. Pancreas: No ductal dilatation or inflammation. Spleen: Normal in size without focal abnormality. Adrenals/Urinary Tract: 18 mm well-circumscribed low-density  right adrenal nodule is unchanged from prior exam. Left adrenal gland is normal. No hydronephrosis. Homogeneous renal enhancement with symmetric excretion on delayed phase imaging. 15 mm simple cyst in the left mid kidney is unchanged. Subcentimeter exophytic lesion from the lower left kidney is also unchanged too small to characterize. Mild symmetric perinephric edema again seen. Urinary bladder is partially distended, there is wall thickening anteriorly, similar to prior exam. Stomach/Bowel: Inflamed diverticulum at the junction of the descending and sigmoid colon with pericolonic stranding, colonic wall thickening common associated inflammatory change. Small amount of free fluid tracks in the left pericolic gutter. No localized abscess or perforation. Findings are it similar site to prior diverticulitis. Moderate colonic stool burden. Normal appendix. Few prominent fluid-filled small bowel loops in the central lower abdomen are likely reactive. No obstruction. Vascular/Lymphatic: Normal caliber abdominal aorta. Mesenteric vessels are patent. Small pericolonic nodes in the region of colonic inflammation. No enlarged lymph nodes in the abdomen or pelvis. Reproductive: Prostate gland spans 4.3 cm. Other: Small amount of free fluid in the left pericolic gutter. No intra-abdominal or pelvic abscess. No free air. Musculoskeletal: There are no acute or suspicious  osseous abnormalities. IMPRESSION: 1. Acute uncomplicated diverticulitis of the junction of the descending and sigmoid colon. No abscess or perforation. This is at same site as prior exam, recommend follow-up colonoscopy after a course of treatment to exclude underlying neoplasm. 2. Unchanged low-density right adrenal nodule, nonspecific but likely adenoma. Electronically Signed   By: Narda Rutherford M.D.   On: 04/08/2018 23:40    Procedures Procedures (including critical care time)  Medications Ordered in ED Medications  sodium chloride 0.9 % bolus 1,000 mL (has no administration in time range)  HYDROmorphone (DILAUDID) injection 1 mg (has no administration in time range)  ondansetron (ZOFRAN) injection 4 mg (has no administration in time range)     Initial Impression / Assessment and Plan / ED Course  I have reviewed the triage vital signs and the nursing notes.  Pertinent labs & imaging results that were available during my care of the patient were reviewed by me and considered in my medical decision making (see chart for details).  Iv ns. Dilaudid 1 mg iv. zofran iv.   Labs.   Reviewed nursing notes and prior charts for additional history.   Ct.  Pt requests additional pain med. Dilaudid 1 mg iv. Pain improved. Ct pending.   Labs reviewed - wbc elevated.   Ct reviewed - diverticulitis, no abscess.  Given persistent symptoms post 2 days po abx, discussed admission w pt. Pt declines admission, requests rx for pain for home - states he took nothing for pain prior to coming to ED.  Pt continues to report feeling improved, and declines admission - pt is willing to accept dose of iv abx in ED.  cipro iv. Flagyl iv.   rx for home. Return precautions provided.     Final Clinical Impressions(s) / ED Diagnoses   Final diagnoses:  None    ED Discharge Orders    None       Cathren Laine, MD 04/08/18 2350

## 2018-04-08 NOTE — ED Notes (Signed)
ED Provider at bedside. 

## 2018-04-09 MED ORDER — HYDROCODONE-ACETAMINOPHEN 5-325 MG PO TABS
1.0000 | ORAL_TABLET | Freq: Once | ORAL | Status: AC
  Administered 2018-04-09: 1 via ORAL
  Filled 2018-04-09: qty 1

## 2019-08-06 ENCOUNTER — Emergency Department (HOSPITAL_COMMUNITY)
Admission: EM | Admit: 2019-08-06 | Discharge: 2019-08-06 | Disposition: A | Discharge status: Home / Self Care | Payer: No Typology Code available for payment source | Attending: Emergency Medicine | Admitting: Emergency Medicine

## 2019-08-06 ENCOUNTER — Emergency Department (HOSPITAL_COMMUNITY): Payer: No Typology Code available for payment source

## 2019-08-06 ENCOUNTER — Encounter (HOSPITAL_COMMUNITY): Payer: Self-pay | Admitting: Emergency Medicine

## 2019-08-06 ENCOUNTER — Other Ambulatory Visit: Payer: Self-pay

## 2019-08-06 DIAGNOSIS — Z79899 Other long term (current) drug therapy: Secondary | ICD-10-CM | POA: Insufficient documentation

## 2019-08-06 DIAGNOSIS — R1032 Left lower quadrant pain: Secondary | ICD-10-CM | POA: Diagnosis present

## 2019-08-06 DIAGNOSIS — I10 Essential (primary) hypertension: Secondary | ICD-10-CM | POA: Insufficient documentation

## 2019-08-06 DIAGNOSIS — K5792 Diverticulitis of intestine, part unspecified, without perforation or abscess without bleeding: Secondary | ICD-10-CM | POA: Diagnosis not present

## 2019-08-06 LAB — COMPREHENSIVE METABOLIC PANEL
ALT: 15 U/L (ref 0–44)
AST: 16 U/L (ref 15–41)
Albumin: 4.2 g/dL (ref 3.5–5.0)
Alkaline Phosphatase: 54 U/L (ref 38–126)
Anion gap: 11 (ref 5–15)
BUN: 15 mg/dL (ref 6–20)
CO2: 22 mmol/L (ref 22–32)
Calcium: 9.6 mg/dL (ref 8.9–10.3)
Chloride: 106 mmol/L (ref 98–111)
Creatinine, Ser: 1.09 mg/dL (ref 0.61–1.24)
GFR calc Af Amer: >60 mL/min (ref >60)
GFR calc non Af Amer: >60 mL/min (ref >60)
Glucose, Bld: 116 mg/dL — ABNORMAL HIGH (ref 70–99)
Potassium: 3.5 mmol/L (ref 3.5–5.1)
Sodium: 139 mmol/L (ref 135–145)
Total Bilirubin: 0.9 mg/dL (ref 0.3–1.2)
Total Protein: 7.6 g/dL (ref 6.5–8.1)

## 2019-08-06 LAB — CBC
HCT: 42.2 % (ref 39.0–52.0)
Hemoglobin: 14.5 g/dL (ref 13.0–17.0)
MCH: 31.9 pg (ref 26.0–34.0)
MCHC: 34.4 g/dL (ref 30.0–36.0)
MCV: 92.7 fL (ref 80.0–100.0)
Platelets: 396 10*3/uL (ref 150–400)
RBC: 4.55 MIL/uL (ref 4.22–5.81)
RDW: 12.7 % (ref 11.5–15.5)
WBC: 14.1 10*3/uL — ABNORMAL HIGH (ref 4.0–10.5)
nRBC: 0 % (ref 0.0–0.2)

## 2019-08-06 MED ORDER — IOHEXOL 300 MG/ML  SOLN
100.0000 mL | Freq: Once | INTRAMUSCULAR | Status: AC | PRN
  Administered 2019-08-06: 13:00:00 100 mL via INTRAVENOUS

## 2019-08-06 MED ORDER — FENTANYL CITRATE (PF) 100 MCG/2ML IJ SOLN
50.0000 ug | Freq: Once | INTRAMUSCULAR | Status: AC
  Administered 2019-08-06: 50 ug via INTRAVENOUS
  Filled 2019-08-06: qty 2

## 2019-08-06 MED ORDER — SODIUM CHLORIDE 0.9 % IV BOLUS
500.0000 mL | Freq: Once | INTRAVENOUS | Status: AC
  Administered 2019-08-06: 500 mL via INTRAVENOUS

## 2019-08-06 MED ORDER — HYDROCODONE-ACETAMINOPHEN 5-325 MG PO TABS: 1.0000 | ORAL_TABLET | Freq: Four times a day (QID) | ORAL | 0 refills | Status: DC | PRN

## 2019-08-06 MED ORDER — ONDANSETRON HCL 4 MG PO TABS: 4.0000 mg | ORAL_TABLET | Freq: Three times a day (TID) | ORAL | 0 refills | Status: DC | PRN

## 2019-08-06 MED ORDER — ONDANSETRON HCL 4 MG/2ML IJ SOLN
4.0000 mg | Freq: Once | INTRAMUSCULAR | Status: AC
  Administered 2019-08-06: 4 mg via INTRAVENOUS
  Filled 2019-08-06: qty 2

## 2019-08-06 MED ORDER — CIPROFLOXACIN HCL 500 MG PO TABS: 500.0000 mg | ORAL_TABLET | Freq: Two times a day (BID) | ORAL | 0 refills | Status: AC

## 2019-08-06 NOTE — Discharge Instructions (Signed)
Continue taking antibiotics as prescribed. Use Zofran as needed for nausea or vomiting. Use Norco as needed for severe breakthrough pain.  Use Tylenol as needed for mild to moderate pain. Follow-up with the stomach doctor listed below for further evaluation of your symptoms. Return to the emergency room if you develop high fevers, persistent vomiting, severe worsening pain, or any new, worsening, or concerning symptoms.

## 2019-08-06 NOTE — ED Triage Notes (Signed)
Pt reports abd pain, LLQ, nausea since yesterday. Pt denies any known fevers. Pt reports hx of diverticulitis.

## 2019-08-06 NOTE — ED Provider Notes (Signed)
[facility] EMERGENCY DEPARTMENT Provider Note   CSN: [id number] Arrival date & time: 08/06/19  1127     History Chief Complaint  Patient presents with  . Abdominal Pain    John Jordan is a 59 y.o. male presenting for evaluation of abdominal pain.  Patient states yesterday he developed left lower quadrant abdominal pain.  Describes as a cramping.  Nothing makes it better or worse.  He has associated nausea without vomiting.  He has a history of diverticulitis, states this feels similar.  He called his doctor at the [place] who sent him a prescription for Flagyl.  He took his dose last night and this morning without significant improvement.  Has been taking ibuprofen for pain, nothing else.  He denies fevers, chills, chest pain, shortness breath, cough, urinary symptoms.  He states he had a small bowel movement yesterday which was hard, no bowel movement today.  He has had extensive work-up with GI in the past several months for intermittent diarrhea without diagnosis made.  He is not currently taking any medicine for this.  He reports a history of 2 previous hernia surgeries as a child, no other abdominal surgeries.  No history of kidney stones.   HPI     Past Medical History:  Diagnosis Date  . Diverticulitis   . Hypertension     There are no problems to display for this patient.   Past Surgical History:  Procedure Laterality Date  . HERNIA REPAIR         History reviewed. No pertinent family history.  Social History   Tobacco Use  . Smoking status: Never Smoker  . Smokeless tobacco: Never Used  Substance Use Topics  . Alcohol use: No  . Drug use: No    Home Medications Prior to Admission medications   Medication Sig Start Date End Date Taking? Authorizing Provider  cholecalciferol (VITAMIN D3) 25 MCG (1000 UNIT) tablet Take 1,000 Units by mouth daily.   Yes [provider]  lisinopril (PRINIVIL,ZESTRIL) 10 MG tablet Take 10 mg by mouth daily.   Yes  [provider]  saccharomyces boulardii (FLORASTOR) 250 MG capsule Take 250 mg by mouth 2 (two) times daily.   Yes [provider]  ciprofloxacin (CIPRO) 500 MG tablet Take 1 tablet (500 mg total) by mouth every 12 (twelve) hours for 7 days. 08/06/19 08/13/19  Shareeka Yim, PA-C  HYDROcodone-acetaminophen (NORCO/VICODIN) 5-325 MG tablet Take 1 tablet by mouth every 6 (six) hours as needed for severe pain. 08/06/19   Dywane Peruski, PA-C  metroNIDAZOLE (FLAGYL) 500 MG tablet Take 1 tablet by mouth 3 (three) times daily. Starting 04/06/2018. 06/06/17   [provider]  ondansetron (ZOFRAN ODT) 8 MG disintegrating tablet Take 1 tablet (8 mg total) by mouth every 8 (eight) hours as needed for nausea or vomiting. Patient not taking: Reported on 08/06/2019 04/08/18   Cathren Laine, MD  ondansetron (ZOFRAN) 4 MG tablet Take 1 tablet (4 mg total) by mouth every 8 (eight) hours as needed for nausea or vomiting. 08/06/19   Divine Hansley, PA-C    Allergies    Nsaids and Sulfa antibiotics  Review of Systems   Review of Systems  Gastrointestinal: Positive for abdominal pain and nausea.  All other systems reviewed and are negative.   Physical Exam Updated Vital Signs BP 129/88   Pulse 73   Temp 97.8 F (36.6 C) (Oral)   Resp 20   Ht 5\' 11"  (1.803 m)   Wt 90.7 kg  SpO2 97%   BMI 27.89 kg/m   Physical Exam Vitals and nursing note reviewed.  Constitutional:      General: He is not in acute distress.    Appearance: He is well-developed.     Comments: Resting comfortably in the bed in no acute distress  HENT:     Head: Normocephalic and atraumatic.  Eyes:     Conjunctiva/sclera: Conjunctivae normal.     Pupils: Pupils are equal, round, and reactive to light.  Cardiovascular:     Rate and Rhythm: Normal rate and regular rhythm.     Pulses: Normal pulses.  Pulmonary:     Effort: Pulmonary effort is normal. No respiratory distress.     Breath sounds: Normal  breath sounds. No wheezing.  Abdominal:     General: Bowel sounds are normal. There is no distension.     Palpations: Abdomen is soft.     Tenderness: There is abdominal tenderness in the left lower quadrant.     Comments: Tenderness palpation of left lower quadrant abdomen.  Voluntary guarding.  No rigidity or distention.  Negative rebound.  No peritonitis.  No CVA tenderness.  Musculoskeletal:        General: Normal range of motion.     Cervical back: Normal range of motion and neck supple.  Skin:    General: Skin is warm and dry.     Capillary Refill: Capillary refill takes less than 2 seconds.  Neurological:     Mental Status: He is alert and oriented to person, place, and time.     ED Results / Procedures / Treatments   Labs (all labs ordered are listed, but only abnormal results are displayed) Labs Reviewed  COMPREHENSIVE METABOLIC PANEL - Abnormal; Notable for the following components:      Result Value   Glucose, Bld 116 (*)    All other components within normal limits  CBC - Abnormal; Notable for the following components:   WBC 14.1 (*)    All other components within normal limits  URINALYSIS, ROUTINE W REFLEX MICROSCOPIC    EKG None  Radiology CT ABDOMEN PELVIS W CONTRAST  Result Date: 08/06/2019 CLINICAL DATA:  Left lower quadrant abdominal pain with nausea since yesterday. History of diverticulitis and hernia repair. EXAM: CT ABDOMEN AND PELVIS WITH CONTRAST TECHNIQUE: Multidetector CT imaging of the abdomen and pelvis was performed using the standard protocol following bolus administration of intravenous contrast. CONTRAST:  OMNIPAQUE IOHEXOL 300 MG/ML  SOLN COMPARISON:  Prior CT 04/08/2018 and 06/06/2017. FINDINGS: Lower chest: Interval improved aeration of the lung bases. No significant pleural or pericardial effusion. Hepatobiliary: The liver is normal in density without suspicious focal abnormality. No evidence of gallstones, gallbladder wall thickening or  biliary dilatation. Pancreas: Unremarkable. No pancreatic ductal dilatation or surrounding inflammatory changes. Spleen: Normal in size without focal abnormality. Adrenals/Urinary Tract: Stable 18 mm right adrenal nodule on image 24/2, consistent with an adenoma based on stability. The left adrenal gland appears normal. Stable small left renal cysts. No evidence of urinary tract calculus or hydronephrosis. The bladder appears unremarkable. Stomach/Bowel: The stomach, small bowel and proximal colon appear normal. The appendix is mildly distended with high density material, although demonstrates no wall thickening or surrounding inflammation. There are diverticular changes within the descending and sigmoid colon with focal wall thickening in the mid descending colon and surrounding inflammation consistent with acute diverticulitis (image 52/2). No evidence of perforation, abscess or obstruction. Vascular/Lymphatic: There are no enlarged abdominal or pelvic lymph nodes.  Minimal aortic and branch vessel atherosclerosis. No acute vascular findings. Reproductive: Stable appearance of the prostate gland. Other: No free air, ascites or abscess. Musculoskeletal: No acute or significant osseous findings. Stable chronic degenerative disc disease at L5-S1. IMPRESSION: 1. Findings are consistent with acute uncomplicated mid descending colon diverticulitis. This is more proximal than the area previously demonstrated. No evidence of perforation, abscess or obstruction. 2. Stable right adrenal adenoma. Electronically Signed   By: Richardean Sale M.D.   On: 08/06/2019 13:11    Procedures Procedures (including critical care time)  Medications Ordered in ED Medications  sodium chloride 0.9 % bolus 500 mL (0 mLs Intravenous Stopped 08/06/19 1355)  fentaNYL (SUBLIMAZE) injection 50 mcg (50 mcg Intravenous Given 08/06/19 1240)  ondansetron (ZOFRAN) injection 4 mg (4 mg Intravenous Given 08/06/19 1240)  iohexol (OMNIPAQUE) 300 MG/ML  solution 100 mL (100 mLs Intravenous Contrast Given 08/06/19 1248)  fentaNYL (SUBLIMAZE) injection 50 mcg (50 mcg Intravenous Given 08/06/19 1353)    ED Course  I have reviewed the triage vital signs and the nursing notes.  Pertinent labs & imaging results that were available during my care of the patient were reviewed by me and considered in my medical decision making (see chart for details).    MDM Rules/Calculators/A&P                      Patient presenting for evaluation of abdominal pain.  Physical exam shows patient appears nontoxic.  He does have focal left lower quadrant tenderness, as well as a history of diverticulitis.  As such, likely diverticulitis.  Also consider kidney stone versus constipation versus bowel obstruction versus viral illness.  Will obtain labs and CT for further evaluation.  Labs show white count of 14, otherwise reassuring.  CT pending.  CT shows uncomplicated diverticulitis. No perf.  On reassessment, pain is improved, and patient remains comfortable and nontoxic in appearance.  Discussed continued antibiotic therapy as well as pain and nausea control.  Patient states he was given only Flagyl for his diverticulitis, not Cipro.  As such, Cipro was also sent to his pharmacy.  Discussed follow-up with GI for further evaluation of intermittent diarrhea.  Discussed return to the ER with worsening symptoms.  At this time, patient appears safe for discharge.  Patient states he understands and agrees to plan.  Final Clinical Impression(s) / ED Diagnoses Final diagnoses:  Diverticulitis    Rx / DC Orders ED Discharge Orders         Ordered    ondansetron (ZOFRAN) 4 MG tablet  Every 8 hours PRN     08/06/19 1344    HYDROcodone-acetaminophen (NORCO/VICODIN) 5-325 MG tablet  Every 6 hours PRN     08/06/19 1344    ciprofloxacin (CIPRO) 500 MG tablet  Every 12 hours     08/06/19 1404           Karigan Cloninger, PA-C 08/06/19 1426    Daleen Bo,  MD 08/07/19 1039

## 2019-11-03 HISTORY — PX: COLONOSCOPY: SHX174

## 2019-11-26 ENCOUNTER — Encounter: Payer: Self-pay | Admitting: Emergency Medicine

## 2019-11-26 ENCOUNTER — Ambulatory Visit
Admission: EM | Admit: 2019-11-26 | Discharge: 2019-11-26 | Disposition: A | Discharge status: Home / Self Care | Payer: No Typology Code available for payment source | Attending: Emergency Medicine | Admitting: Emergency Medicine

## 2019-11-26 ENCOUNTER — Other Ambulatory Visit: Payer: Self-pay

## 2019-11-26 DIAGNOSIS — R109 Unspecified abdominal pain: Secondary | ICD-10-CM

## 2019-11-26 DIAGNOSIS — N390 Urinary tract infection, site not specified: Secondary | ICD-10-CM | POA: Diagnosis not present

## 2019-11-26 DIAGNOSIS — T83511A Infection and inflammatory reaction due to indwelling urethral catheter, initial encounter: Secondary | ICD-10-CM | POA: Insufficient documentation

## 2019-11-26 LAB — POCT URINALYSIS DIP (MANUAL ENTRY)
Bilirubin, UA: NEGATIVE
Glucose, UA: NEGATIVE mg/dL
Nitrite, UA: NEGATIVE
Protein Ur, POC: 100 mg/dL — AB
Spec Grav, UA: 1.025 (ref 1.010–1.025)
Urobilinogen, UA: 0.2 E.U./dL
pH, UA: 5.5 (ref 5.0–8.0)

## 2019-11-26 MED ORDER — CIPROFLOXACIN HCL 500 MG PO TABS: 500.0000 mg | ORAL_TABLET | Freq: Two times a day (BID) | ORAL | 0 refills | Status: AC

## 2019-11-26 MED ORDER — CEFTRIAXONE SODIUM 1 G IJ SOLR
1.0000 g | Freq: Once | INTRAMUSCULAR | Status: AC
  Administered 2019-11-26: 1 g via INTRAMUSCULAR

## 2019-11-26 MED ORDER — KETOROLAC TROMETHAMINE 60 MG/2ML IM SOLN
60.0000 mg | Freq: Once | INTRAMUSCULAR | Status: AC
  Administered 2019-11-26: 60 mg via INTRAMUSCULAR

## 2019-11-26 MED ORDER — ONDANSETRON 4 MG PO TBDP
4.0000 mg | ORAL_TABLET | Freq: Once | ORAL | Status: AC
  Administered 2019-11-26: 4 mg via ORAL

## 2019-11-26 NOTE — ED Provider Notes (Signed)
[place]   CC: UTI symptoms  SUBJECTIVE:  John Jordan is a 59 y.o. male who complains of chills, subjective fever, nausea, vomiting, dark urine, strong smelling urine and RT flank pain x few days.  Patient admits to self-cathing. 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   Localizes the pain to the RT flank.  Pain is constant and describes it as 10/10.  Has tried OTC medications without relief.  Symptoms are made worse with urination.  Denies similar symptoms in the past.  Denies abdominal pain, hematuria.    LMP: No LMP for male patient.  ROS: As in HPI.  All other pertinent ROS negative.     Past Medical History:  Diagnosis Date  . Diverticulitis   . Hypertension    Past Surgical History:  Procedure Laterality Date  . HERNIA REPAIR     Allergies  Allergen Reactions  . Nsaids Other (See Comments)    GI Upset  . Sulfa Antibiotics Rash   No current facility-administered medications on file prior to encounter.   Current Outpatient Medications on File Prior to Encounter  Medication Sig Dispense Refill  . cholecalciferol (VITAMIN D3) 25 MCG (1000 UNIT) tablet Take 1,000 Units by mouth daily.    Marland Kitchen HYDROcodone-acetaminophen (NORCO/VICODIN) 5-325 MG tablet Take 1 tablet by mouth every 6 (six) hours as needed for severe pain. 8 tablet 0  . lisinopril (PRINIVIL,ZESTRIL) 10 MG tablet Take 10 mg by mouth daily.    . ondansetron (ZOFRAN ODT) 8 MG disintegrating tablet Take 1 tablet (8 mg total) by mouth every 8 (eight) hours as needed for nausea or vomiting. 10 tablet 0  . pantoprazole (PROTONIX) 20 MG tablet Take 20 mg by mouth daily.    Marland Kitchen saccharomyces boulardii (FLORASTOR) 250 MG capsule Take 250 mg by mouth 2 (two) times daily.     Social History   Socioeconomic History  . Marital status: Divorced    Spouse name: Not on file  . Number of children: Not on file  . Years of education: Not on file  . Highest education level: Not on file  Occupational History  . Not on  file  Tobacco Use  . Smoking status: Never Smoker  . Smokeless tobacco: Never Used  Substance and Sexual Activity  . Alcohol use: No  . Drug use: No  . Sexual activity: Not on file  Other Topics Concern  . Not on file  Social History Narrative  . Not on file   Social Determinants of Health   Financial Resource Strain:   . Difficulty of Paying Living Expenses:   Food Insecurity:   . Worried About Charity fundraiser in the Last Year:   . Arboriculturist in the Last Year:   Transportation Needs:   . Film/video editor (Medical):   Marland Kitchen Lack of Transportation (Non-Medical):   Physical Activity:   . Days of Exercise per Week:   . Minutes of Exercise per Session:   Stress:   . Feeling of Stress :   Social Connections:   . Frequency of Communication with Friends and Family:   . Frequency of Social Gatherings with Friends and Family:   . Attends Religious Services:   . Active Member of Clubs or Organizations:   . Attends Archivist Meetings:   Marland Kitchen Marital Status:   Intimate Partner Violence:   . Fear of Current or Ex-Partner:   . Emotionally Abused:   Marland Kitchen Physically Abused:   . Sexually Abused:  No family history on file.  OBJECTIVE:  Vitals:   11/26/19 1709 11/26/19 1714  BP: 127/80   Pulse: 92   Resp: 17   Temp: 98.4 F (36.9 C)   TempSrc: Oral   SpO2: 96%   Weight:  200 lb (90.7 kg)  Height:  5\' 11"  (1.803 m)   General appearance: Alert; appears uncomfortable, but nontoxic HEENT: NCAT.  Oropharynx clear.  Lungs: clear to auscultation bilaterally without adventitious breath sounds Heart: regular rate and rhythm.   Abdomen: soft; non-distended; no tenderness; bowel sounds present; no guarding Back: + RT flank Extremities: no edema; symmetrical with no gross deformities Skin: warm and dry Neurologic: Ambulates from chair to exam table without difficulty Psychological: alert and cooperative; normal mood and affect  Labs Reviewed  POCT URINALYSIS  DIP (MANUAL ENTRY) - Abnormal; Notable for the following components:      Result Value   Color, UA straw (*)    Clarity, UA cloudy (*)    Ketones, POC UA trace (5) (*)    Blood, UA small (*)    Protein Ur, POC =100 (*)    Leukocytes, UA Moderate (2+) (*)    All other components within normal limits  URINE CULTURE    ASSESSMENT & PLAN:  1. Urinary tract infection associated with catheterization of urinary tract, unspecified indwelling urinary catheter type, initial encounter (HCC)   2. Right flank pain     Meds ordered this encounter  Medications  . ciprofloxacin (CIPRO) 500 MG tablet    Sig: Take 1 tablet (500 mg total) by mouth 2 (two) times daily for 10 days.    Dispense:  20 tablet    Refill:  0    Order Specific Question:   Supervising Provider    Answer:   Eustace Moore  . ketorolac (TORADOL) injection 60 mg  . cefTRIAXone (ROCEPHIN) injection 1 g   Urine culture sent.  We will call you with the results.   Push fluids and get plenty of rest.   Take antibiotic as directed and to completion Follow up with PCP if symptoms persists Return here or go to ER if you have any new or worsening symptoms such as fever, abdominal pain, nausea/vomiting, flank pain, etc...  Gram of rocephin and toradol given in office.    Outlined signs and symptoms indicating need for more acute intervention. Patient verbalized understanding. After Visit Summary given.     [[id number]], PA-C 11/26/19 1751

## 2019-11-26 NOTE — Discharge Instructions (Signed)
Urine culture sent.  We will call you with the results.   Push fluids and get plenty of rest.   Take antibiotic as directed and to completion Follow up with PCP if symptoms persists Return here or go to ER if you have any new or worsening symptoms such as fever, abdominal pain, nausea/vomiting, flank pain, etc..Marland Kitchen

## 2019-11-26 NOTE — ED Triage Notes (Addendum)
Pt self caths.  For past few days pt has had chills, back pain, sweating, decreased urine output, dark and strong smelling urine.  Nausea and decreased appetite.

## 2019-11-28 LAB — URINE CULTURE: Culture: >=100000 — AB

## 2020-01-17 ENCOUNTER — Telehealth: Payer: Self-pay | Admitting: Gastroenterology

## 2020-02-05 NOTE — Telephone Encounter (Signed)
Records received and reviewed):  -Colonoscopy (06/03/2016, Dr. Veneta Penton, MD, [facility]): Sigmoid diverticulosis, internal hemorrhoids.  Repeat in 5 years due to family history -Colonoscopy (11/16/2019; indication: Chronic diarrhea): Sigmoid diverticulosis, otherwise normal.  Biopsies negative for Microscopic Colitis.  Repeat in 5 years due to family history -EGD (11/14/2019; indication: GERD): Normal esophagus.  Unable to examine stomach and duodenum as patient was awake and combative (sedated with Versed 7 mg, fentanyl 125 mcg, Benadryl 50 mg)  Referral received from this patient's Gastroenterologist, Derek Jack: Request for EGD with MAC given intolerance to conscious sedation during recent procedure.  Reflux currently treated with pantoprazole 40 mg daily  Okay to schedule direct with me for EGD in [place] (MAC sedation) for evaluation of reflux, Barrett's screening, with plan to follow-up with his Gastroenterologist at the [place] afterwards.

## 2020-02-12 ENCOUNTER — Encounter: Payer: Self-pay | Admitting: Gastroenterology

## 2020-03-27 ENCOUNTER — Other Ambulatory Visit: Payer: Self-pay

## 2020-03-27 ENCOUNTER — Ambulatory Visit (AMBULATORY_SURGERY_CENTER): Payer: Self-pay

## 2020-03-27 VITALS — Ht 71.0 in | Wt 206.0 lb

## 2020-03-27 DIAGNOSIS — K219 Gastro-esophageal reflux disease without esophagitis: Secondary | ICD-10-CM

## 2020-03-27 DIAGNOSIS — R112 Nausea with vomiting, unspecified: Secondary | ICD-10-CM

## 2020-03-27 NOTE — Progress Notes (Signed)
No egg or soy allergy known to patient  No issues with past sedation with any surgeries or procedures No intubation problems in the past  No FH of Malignant Hyperthermia No diet pills per patient No home 02 use per patient  No blood thinners per patient  Pt denies issues with constipation  No A fib or A flutter  COVID 19 guidelines implemented in PV today with Pt and RN  COVID vaccines completed on 12/2019 per pt;  Due to the COVID-19 pandemic we are asking patients to follow these guidelines. Please only bring one care partner. Please be aware that your care partner may wait in the car in the parking lot or if they feel like they will be too hot to wait in the car, they may wait in the lobby on the 4th floor. All care partners are required to wear a mask the entire time (we do not have any that we can provide them), they need to practice social distancing, and we will do a Covid check for all patient's and care partners when you arrive. Also we will check their temperature and your temperature. If the care partner waits in their car they need to stay in the parking lot the entire time and we will call them on their cell phone when the patient is ready for discharge so they can bring the car to the front of the building. Also all patient's will need to wear a mask into building.  

## 2020-04-10 ENCOUNTER — Encounter: Payer: Self-pay | Admitting: Gastroenterology

## 2020-04-10 ENCOUNTER — Other Ambulatory Visit: Payer: Self-pay

## 2020-04-10 ENCOUNTER — Ambulatory Visit (AMBULATORY_SURGERY_CENTER): Payer: No Typology Code available for payment source | Admitting: Gastroenterology

## 2020-04-10 VITALS — BP 105/76 | HR 54 | Temp 98.0°F | Resp 9 | Ht 71.0 in | Wt 206.0 lb

## 2020-04-10 DIAGNOSIS — R112 Nausea with vomiting, unspecified: Secondary | ICD-10-CM

## 2020-04-10 DIAGNOSIS — R1319 Other dysphagia: Secondary | ICD-10-CM

## 2020-04-10 DIAGNOSIS — K219 Gastro-esophageal reflux disease without esophagitis: Secondary | ICD-10-CM | POA: Diagnosis not present

## 2020-04-10 MED ORDER — SODIUM CHLORIDE 0.9 % IV SOLN: 500.0000 mL | Freq: Once | INTRAVENOUS | Status: DC

## 2020-04-10 NOTE — Patient Instructions (Signed)
Handout given for Post-Dilation diet.  YOU HAD AN ENDOSCOPIC PROCEDURE TODAY AT THE [facility]:   Refer to the procedure report that was given to you for any specific questions about what was found during the examination.  If the procedure report does not answer your questions, please call your gastroenterologist to clarify.  If you requested that your care partner not be given the details of your procedure findings, then the procedure report has been included in a sealed envelope for you to review at your convenience later.  YOU SHOULD EXPECT: Some feelings of bloating in the abdomen. Passage of more gas than usual.  Walking can help get rid of the air that was put into your GI tract during the procedure and reduce the bloating. If you had a lower endoscopy (such as a colonoscopy or flexible sigmoidoscopy) you may notice spotting of blood in your stool or on the toilet paper. If you underwent a bowel prep for your procedure, you may not have a normal bowel movement for a few days.  Please Note:  You might notice some irritation and congestion in your nose or some drainage.  This is from the oxygen used during your procedure.  There is no need for concern and it should clear up in a day or so.  SYMPTOMS TO REPORT IMMEDIATELY:   Following upper endoscopy (EGD)  Vomiting of blood or coffee ground material  New chest pain or pain under the shoulder blades  Painful or persistently difficult swallowing  New shortness of breath  Fever of 100F or higher  Black, tarry-looking stools  For urgent or emergent issues, a gastroenterologist can be reached at any hour by calling [phone]. Do not use MyChart messaging for urgent concerns.    DIET:  SEE POST DILATION DIET HANDOUT. Clear liquids until 1130 am, then soft diet.  We do recommend a small meal at first, but then you may proceed to your regular diet tomorrow.  Drink plenty of fluids but you should avoid alcoholic beverages for 24  hours.  ACTIVITY:  You should plan to take it easy for the rest of today and you should NOT DRIVE or use heavy machinery until tomorrow (because of the sedation medicines used during the test).    FOLLOW UP: Our staff will call the number listed on your records 48-72 hours following your procedure to check on you and address any questions or concerns that you may have regarding the information given to you following your procedure. If we do not reach you, we will leave a message.  We will attempt to reach you two times.  During this call, we will ask if you have developed any symptoms of COVID 19. If you develop any symptoms (ie: fever, flu-like symptoms, shortness of breath, cough etc.) before then, please call [phone].  If you test positive for Covid 19 in the 2 weeks post procedure, please call and report this information to [place].    If any biopsies were taken you will be contacted by phone or by letter within the next 1-3 weeks.  Please call us at [phone] if you have not heard about the biopsies in 3 weeks.    SIGNATURES/CONFIDENTIALITY: You and/or your care partner have signed paperwork which will be entered into your electronic medical record.  These signatures attest to the fact that that the information above on your After Visit Summary has been reviewed and is understood.  Full responsibility of the confidentiality of this discharge  information lies with you and/or your care-partner. 

## 2020-04-10 NOTE — Progress Notes (Signed)
VS- John Jordan  Pt's states no medical or surgical changes since previsit or office visit.  

## 2020-04-10 NOTE — Progress Notes (Signed)
Called to room to assist during endoscopic procedure.  Patient ID and intended procedure confirmed with present staff. Received instructions for my participation in the procedure from the performing physician.  

## 2020-04-10 NOTE — Progress Notes (Signed)
To PACU, VSS. Report to [email or web address] 

## 2020-04-10 NOTE — Op Note (Signed)
[place] Patient Name: John Jordan Procedure Date: 04/10/2020 10:04 AM MRN: [id number] Endoscopist: Gerrit Heck , MD Age: 59 Referring MD:  Date of Birth: Feb 24, 1961 Gender: Male Account #: [id number] Procedure:                Upper GI endoscopy Indications:              Dysphagia, Esophageal reflux, Screening for                            Barrett's esophagus                           59 yo male with a long-standing history of GERD,                            relatively well controlled on current PPI therapy,                            along with intermittent solid food dysphagia,                            referred by Dr. Derek Jack at the [facility] for EGD with MAC assistance. Attempted                            EGD in 11/2019 but aborted due to difficulty with                            moderate sedation. Medicines:                Monitored Anesthesia Care Procedure:                Pre-Anesthesia Assessment:                           - Prior to the procedure, a History and Physical                            was performed, and patient medications and                            allergies were reviewed. The patient's tolerance of                            previous anesthesia was also reviewed. The risks                            and benefits of the procedure and the sedation                            options and risks were discussed with the patient.  All questions were answered, and informed consent                            was obtained. Prior Anticoagulants: The patient has                            taken no previous anticoagulant or antiplatelet                            agents. ASA Grade Assessment: II - A patient with                            mild systemic disease. After reviewing the risks                            and benefits, the patient was deemed in                             satisfactory condition to undergo the procedure.                           After obtaining informed consent, the endoscope was                            passed under direct vision. Throughout the                            procedure, the patient's blood pressure, pulse, and                            oxygen saturations were monitored continuously. The                            Endoscope was introduced through the mouth, and                            advanced to the second part of duodenum. The upper                            GI endoscopy was accomplished without difficulty.                            The patient tolerated the procedure well. Scope In: Scope Out: Findings:                 The examined esophagus was normal. The scope was                            withdrawn. Given endorsement of dysphagia, decision                            was made for empiric esophageal dilation. Dilation  was performed with a Maloney dilator with no                            resistance at 73 Fr. The dilation site was examined                            following endoscope reinsertion and showed no                            bleeding, mucosal tear or perforation. Estimated                            blood loss: none.                           The Z-line was regular and was found 41 cm from the                            incisors.                           The gastroesophageal flap valve was visualized                            endoscopically and classified as Hill Grade II                            (fold present, opens with respiration).                           The entire examined stomach was normal.                           The examined duodenum was normal. Complications:            No immediate complications. Estimated Blood Loss:     Estimated blood loss: none. Impression:               - Normal esophagus. Dilated with 54 Fr Maloney                             dilator.                           - Z-line regular, 41 cm from the incisors.                           - Gastroesophageal flap valve classified as Hill                            Grade II (fold present, opens with respiration).                           - Normal stomach.                           -  Normal examined duodenum.                           - No specimens collected. Recommendation:           - Patient has a contact number available for                            emergencies. The signs and symptoms of potential                            delayed complications were discussed with the                            patient. Return to normal activities tomorrow.                            Written discharge instructions were provided to the                            patient.                           - Resume previous diet.                           - Continue present medications.                           - Follow-up with Dr. Derek Jack at the [facility]                            clinic at appointment to be scheduled. Gerrit Heck, MD 04/10/2020 10:33:35 AM

## 2020-04-14 ENCOUNTER — Telehealth: Payer: Self-pay

## 2020-04-14 NOTE — Telephone Encounter (Signed)
  Follow up Call-  Call back number 04/10/2020  Post procedure Call Back phone  # [phone]  Permission to leave phone message Yes  Some recent data might be hidden     Patient questions:  Do you have a fever, pain , or abdominal swelling? No. Pain Score  0 *  Have you tolerated food without any problems? Yes.    Have you been able to return to your normal activities? Yes.    Do you have any questions about your discharge instructions: Diet   No. Medications  No. Follow up visit  No.  Do you have questions or concerns about your Care? No.  Actions: * If pain score is 4 or above: No action needed, pain <4.  1. Have you developed a fever since your procedure? no  2.   Have you had an respiratory symptoms (SOB or cough) since your procedure? no  3.   Have you tested positive for COVID 19 since your procedure no  4.   Have you had any family members/close contacts diagnosed with the COVID 19 since your procedure?  no   If yes to any of these questions please route to Laverna Peace, RN and Karlton Lemon, RN

## 2020-05-30 ENCOUNTER — Emergency Department (HOSPITAL_COMMUNITY)
Admission: EM | Admit: 2020-05-30 | Discharge: 2020-05-30 | Disposition: A | Discharge status: Home / Self Care | Payer: No Typology Code available for payment source | Attending: Emergency Medicine | Admitting: Emergency Medicine

## 2020-05-30 ENCOUNTER — Encounter (HOSPITAL_COMMUNITY): Payer: Self-pay

## 2020-05-30 ENCOUNTER — Emergency Department (HOSPITAL_COMMUNITY): Payer: No Typology Code available for payment source

## 2020-05-30 ENCOUNTER — Other Ambulatory Visit: Payer: Self-pay

## 2020-05-30 DIAGNOSIS — R1032 Left lower quadrant pain: Secondary | ICD-10-CM | POA: Diagnosis present

## 2020-05-30 DIAGNOSIS — K5732 Diverticulitis of large intestine without perforation or abscess without bleeding: Secondary | ICD-10-CM | POA: Diagnosis not present

## 2020-05-30 DIAGNOSIS — K5792 Diverticulitis of intestine, part unspecified, without perforation or abscess without bleeding: Secondary | ICD-10-CM

## 2020-05-30 DIAGNOSIS — K219 Gastro-esophageal reflux disease without esophagitis: Secondary | ICD-10-CM | POA: Diagnosis not present

## 2020-05-30 DIAGNOSIS — F17200 Nicotine dependence, unspecified, uncomplicated: Secondary | ICD-10-CM | POA: Insufficient documentation

## 2020-05-30 DIAGNOSIS — Z79899 Other long term (current) drug therapy: Secondary | ICD-10-CM | POA: Insufficient documentation

## 2020-05-30 DIAGNOSIS — I10 Essential (primary) hypertension: Secondary | ICD-10-CM | POA: Insufficient documentation

## 2020-05-30 LAB — URINALYSIS, ROUTINE W REFLEX MICROSCOPIC
Bilirubin Urine: NEGATIVE
Glucose, UA: NEGATIVE mg/dL
Hgb urine dipstick: NEGATIVE
Ketones, ur: NEGATIVE mg/dL
Leukocytes,Ua: NEGATIVE
Nitrite: NEGATIVE
Protein, ur: NEGATIVE mg/dL
Specific Gravity, Urine: 1.011 (ref 1.005–1.030)
pH: 7 (ref 5.0–8.0)

## 2020-05-30 LAB — COMPREHENSIVE METABOLIC PANEL
ALT: 19 U/L (ref 0–44)
AST: 19 U/L (ref 15–41)
Albumin: 4.1 g/dL (ref 3.5–5.0)
Alkaline Phosphatase: 54 U/L (ref 38–126)
Anion gap: 9 (ref 5–15)
BUN: 10 mg/dL (ref 6–20)
CO2: 21 mmol/L — ABNORMAL LOW (ref 22–32)
Calcium: 9.5 mg/dL (ref 8.9–10.3)
Chloride: 106 mmol/L (ref 98–111)
Creatinine, Ser: 1.13 mg/dL (ref 0.61–1.24)
GFR, Estimated: >60 mL/min (ref >60)
Glucose, Bld: 110 mg/dL — ABNORMAL HIGH (ref 70–99)
Potassium: 3.8 mmol/L (ref 3.5–5.1)
Sodium: 136 mmol/L (ref 135–145)
Total Bilirubin: 0.6 mg/dL (ref 0.3–1.2)
Total Protein: 7.6 g/dL (ref 6.5–8.1)

## 2020-05-30 LAB — CBC WITH DIFFERENTIAL/PLATELET
Abs Immature Granulocytes: 0.03 10*3/uL (ref 0.00–0.07)
Basophils Absolute: 0 10*3/uL (ref 0.0–0.1)
Basophils Relative: 0 %
Eosinophils Absolute: 0.2 10*3/uL (ref 0.0–0.5)
Eosinophils Relative: 2 %
HCT: 41.2 % (ref 39.0–52.0)
Hemoglobin: 14 g/dL (ref 13.0–17.0)
Immature Granulocytes: 0 %
Lymphocytes Relative: 18 %
Lymphs Abs: 1.8 10*3/uL (ref 0.7–4.0)
MCH: 32.4 pg (ref 26.0–34.0)
MCHC: 34 g/dL (ref 30.0–36.0)
MCV: 95.4 fL (ref 80.0–100.0)
Monocytes Absolute: 0.8 10*3/uL (ref 0.1–1.0)
Monocytes Relative: 9 %
Neutro Abs: 7.1 10*3/uL (ref 1.7–7.7)
Neutrophils Relative %: 71 %
Platelets: 344 10*3/uL (ref 150–400)
RBC: 4.32 MIL/uL (ref 4.22–5.81)
RDW: 12.7 % (ref 11.5–15.5)
WBC: 9.9 10*3/uL (ref 4.0–10.5)
nRBC: 0 % (ref 0.0–0.2)

## 2020-05-30 LAB — LIPASE, BLOOD: Lipase: 21 U/L (ref 11–51)

## 2020-05-30 MED ORDER — IOHEXOL 300 MG/ML  SOLN
100.0000 mL | Freq: Once | INTRAMUSCULAR | Status: AC | PRN
  Administered 2020-05-30: 100 mL via INTRAVENOUS

## 2020-05-30 MED ORDER — ONDANSETRON HCL 4 MG/2ML IJ SOLN
4.0000 mg | Freq: Once | INTRAMUSCULAR | Status: AC
  Administered 2020-05-30: 4 mg via INTRAVENOUS
  Filled 2020-05-30: qty 2

## 2020-05-30 MED ORDER — HYDROMORPHONE HCL 1 MG/ML IJ SOLN
1.0000 mg | Freq: Once | INTRAMUSCULAR | Status: AC
  Administered 2020-05-30: 1 mg via INTRAVENOUS
  Filled 2020-05-30: qty 1

## 2020-05-30 MED ORDER — METRONIDAZOLE IN NACL 5-0.79 MG/ML-% IV SOLN: 500.0000 mg | Freq: Once | INTRAVENOUS | Status: DC

## 2020-05-30 MED ORDER — ONDANSETRON 4 MG PO TBDP: ORAL_TABLET | ORAL | 0 refills | Status: DC

## 2020-05-30 MED ORDER — HYDROCODONE-ACETAMINOPHEN 5-325 MG PO TABS
1.0000 | ORAL_TABLET | Freq: Four times a day (QID) | ORAL | 0 refills | Status: DC | PRN
Start: 2020-05-30 — End: 2022-05-03

## 2020-05-30 MED ORDER — METRONIDAZOLE 500 MG PO TABS
500.0000 mg | ORAL_TABLET | Freq: Once | ORAL | Status: AC
  Administered 2020-05-30: 500 mg via ORAL

## 2020-05-30 MED ORDER — METRONIDAZOLE 500 MG PO TABS: ORAL_TABLET | ORAL | 0 refills | Status: DC

## 2020-05-30 MED ORDER — CIPROFLOXACIN IN D5W 400 MG/200ML IV SOLN
400.0000 mg | Freq: Once | INTRAVENOUS | Status: AC
  Administered 2020-05-30: 400 mg via INTRAVENOUS
  Filled 2020-05-30: qty 200

## 2020-05-30 MED ORDER — CIPROFLOXACIN HCL 500 MG PO TABS: ORAL_TABLET | ORAL | 0 refills | Status: DC

## 2020-05-30 NOTE — ED Triage Notes (Signed)
Pt reports reports left lower abdominal x 3 days gradually getting worse . Pt has hx of diverticulitis. Pt reports mucous like stools

## 2020-05-30 NOTE — ED Provider Notes (Signed)
[facility] EMERGENCY DEPARTMENT Provider Note   CSN: [id number] Arrival date & time: 05/30/20  1975     History Chief Complaint  Patient presents with  . Abdominal Pain    John Jordan is a 59 y.o. male.  Patient complains of left lower quadrant pain for couple days.  Patient has history of diverticulitis before.  He believes he has diverticulitis again  The history is provided by the patient and medical records. No language interpreter was used.  Abdominal Pain Pain location:  LLQ Pain quality: aching   Pain radiates to:  Does not radiate Pain severity:  Moderate Onset quality:  Sudden Timing:  Constant Progression:  Worsening Chronicity:  Recurrent Context: not alcohol use   Relieved by:  Nothing Worsened by:  Nothing Associated symptoms: no chest pain, no cough, no diarrhea, no fatigue and no hematuria        Past Medical History:  Diagnosis Date  . Allergy    seasonal allergies  . Anxiety   . Arthritis    generalized  . Depression   . Diverticulitis   . GERD (gastroesophageal reflux disease)    on meds  . Hypertension    on meds    There are no problems to display for this patient.   Past Surgical History:  Procedure Laterality Date  . [facility]  [facility]   [facility]  . HERNIA REPAIR Bilateral    bilateral inguinal hernia repair  . WISDOM TOOTH EXTRACTION         Family History  Problem Relation Age of Onset  . Kidney cancer Father 48  . Kidney disease Father   . Colon cancer Paternal Aunt   . Esophageal cancer Neg Hx   . Rectal cancer Neg Hx   . Stomach cancer Neg Hx     Social History   Tobacco Use  . Smoking status: Current Some Day Smoker  . Smokeless tobacco: Never Used  Vaping Use  . Vaping Use: Never used  Substance Use Topics  . Alcohol use: No    Comment: special occassions  . Drug use: No    Home Medications Prior to Admission medications   Medication Sig Start Date End Date  Taking? Authorizing Provider  cholecalciferol (VITAMIN D3) 25 MCG (1000 UNIT) tablet Take 1,000 Units by mouth daily.    [provider]  ciprofloxacin (CIPRO) 500 MG tablet One po bid 05/30/20   Bethann Berkshire, MD  Cyanocobalamin (VITAMIN B-12 PO) Take by mouth daily.    [provider]  HYDROcodone-acetaminophen (NORCO/VICODIN) 5-325 MG tablet Take 1 tablet by mouth every 6 (six) hours as needed. 05/30/20   Bethann Berkshire, MD  hydrocortisone 2.5 % cream SMARTSIG:1 Topical Every Night 12/31/19   [provider]  ketoconazole (NIZORAL) 2 % shampoo Apply 1 application topically 2 (two) times a week. 12/31/19   [provider]  lisinopril (PRINIVIL,ZESTRIL) 10 MG tablet Take 10 mg by mouth daily.    [provider]  metroNIDAZOLE (FLAGYL) 500 MG tablet One po bid 05/30/20   Bethann Berkshire, MD  ondansetron (ZOFRAN ODT) 4 MG disintegrating tablet 4mg  ODT q4 hours prn nausea/vomit 05/30/20   06/01/20, MD  pantoprazole (PROTONIX) 40 MG tablet Take 40 mg by mouth 2 (two) times daily. 12/31/19   [provider]  saccharomyces boulardii (FLORASTOR) 250 MG capsule Take 250 mg by mouth 2 (two) times daily.    [provider]    Allergies    Nsaids and  Sulfa antibiotics  Review of Systems   Review of Systems  Constitutional: Negative for appetite change and fatigue.  HENT: Negative for congestion, ear discharge and sinus pressure.   Eyes: Negative for discharge.  Respiratory: Negative for cough.   Cardiovascular: Negative for chest pain.  Gastrointestinal: Positive for abdominal pain. Negative for diarrhea.  Genitourinary: Negative for frequency and hematuria.  Musculoskeletal: Negative for back pain.  Skin: Negative for rash.  Neurological: Negative for seizures and headaches.  Psychiatric/Behavioral: Negative for hallucinations.    Physical Exam Updated Vital Signs BP (!) 144/97   Pulse 65   Temp 98.4 F (36.9 C) (Oral)    Resp 14   Ht 5\' 11"  (1.803 m)   Wt 90.7 kg   SpO2 96%   BMI 27.89 kg/m   Physical Exam Vitals and nursing note reviewed.  Constitutional:      Appearance: He is well-developed.  HENT:     Head: Normocephalic.     Nose: Nose normal.  Eyes:     General: No scleral icterus.    Conjunctiva/sclera: Conjunctivae normal.  Neck:     Thyroid: No thyromegaly.  Cardiovascular:     Rate and Rhythm: Normal rate and regular rhythm.     Heart sounds: No murmur heard.  No friction rub. No gallop.   Pulmonary:     Breath sounds: No stridor. No wheezing or rales.  Chest:     Chest wall: No tenderness.  Abdominal:     General: There is no distension.     Tenderness: There is abdominal tenderness. There is no rebound.     Comments: Tender left lower quadrant  Musculoskeletal:        General: Normal range of motion.     Cervical back: Neck supple.  Lymphadenopathy:     Cervical: No cervical adenopathy.  Skin:    Findings: No erythema or rash.  Neurological:     Mental Status: He is alert and oriented to person, place, and time.     Motor: No abnormal muscle tone.     Coordination: Coordination normal.  Psychiatric:        Behavior: Behavior normal.     ED Results / Procedures / Treatments   Labs (all labs ordered are listed, but only abnormal results are displayed) Labs Reviewed  COMPREHENSIVE METABOLIC PANEL - Abnormal; Notable for the following components:      Result Value   CO2 21 (*)    Glucose, Bld 110 (*)    All other components within normal limits  LIPASE, BLOOD  URINALYSIS, ROUTINE W REFLEX MICROSCOPIC  CBC WITH DIFFERENTIAL/PLATELET  CBC WITH DIFFERENTIAL/PLATELET    EKG None  Radiology CT ABDOMEN PELVIS W CONTRAST  Result Date: 05/30/2020 CLINICAL DATA:  Left lower quadrant pain EXAM: CT ABDOMEN AND PELVIS WITH CONTRAST TECHNIQUE: Multidetector CT imaging of the abdomen and pelvis was performed using the standard protocol following bolus administration of  intravenous contrast. CONTRAST:  06/01/2020 OMNIPAQUE IOHEXOL 300 MG/ML  SOLN COMPARISON:  08/06/2019 FINDINGS: Lower chest: No acute abnormality. Hepatobiliary: No focal hepatic abnormality. Gallbladder unremarkable. Pancreas: No focal abnormality or ductal dilatation. Spleen: No focal abnormality.  Normal size. Adrenals/Urinary Tract: Small left renal cysts. No hydronephrosis. Adrenal glands and urinary bladder unremarkable. Stomach/Bowel: Sigmoid and descending colonic diverticulosis. Inflammatory stranding around the mid sigmoid colon compatible with active diverticulitis. Normal appendix. Stomach and small bowel decompressed, unremarkable. Vascular/Lymphatic: No evidence of aneurysm or adenopathy. Reproductive: No visible focal abnormality. Other: No free fluid or free  air. Musculoskeletal: No acute bony abnormality. IMPRESSION: Left colonic diverticulosis. Inflammatory stranding around the sigmoid colon compatible with active diverticulitis. Electronically Signed   By: Charlett Nose M.D.   On: 05/30/2020 11:30    Procedures Procedures (including critical care time)  Medications Ordered in ED Medications  metroNIDAZOLE (FLAGYL) IVPB 500 mg (500 mg Intravenous Not Given 05/30/20 1248)  ciprofloxacin (CIPRO) IVPB 400 mg (400 mg Intravenous New Bag/Given 05/30/20 1242)  HYDROmorphone (DILAUDID) injection 1 mg (1 mg Intravenous Given 05/30/20 0940)  ondansetron (ZOFRAN) injection 4 mg (4 mg Intravenous Given 05/30/20 0941)  iohexol (OMNIPAQUE) 300 MG/ML solution 100 mL (100 mLs Intravenous Contrast Given 05/30/20 1101)  HYDROmorphone (DILAUDID) injection 1 mg (1 mg Intravenous Given 05/30/20 1240)  ondansetron (ZOFRAN) injection 4 mg (4 mg Intravenous Given 05/30/20 1240)  metroNIDAZOLE (FLAGYL) tablet 500 mg (500 mg Oral Given 05/30/20 1249)    ED Course  I have reviewed the triage vital signs and the nursing notes.  Pertinent labs & imaging results that were available during my care of the patient  were reviewed by me and considered in my medical decision making (see chart for details).    MDM Rules/Calculators/A&P                          Patient with uncomplicated diverticulitis.  He will be placed on Cipro and Flagyl.  Patient prefers outpatient treatment.  He will follow up with primary care doctor or his GI doctor Final Clinical Impression(s) / ED Diagnoses Final diagnoses:  Diverticulitis    Rx / DC Orders ED Discharge Orders         Ordered    metroNIDAZOLE (FLAGYL) 500 MG tablet        05/30/20 1330    ciprofloxacin (CIPRO) 500 MG tablet        05/30/20 1330    ondansetron (ZOFRAN ODT) 4 MG disintegrating tablet        05/30/20 1330    HYDROcodone-acetaminophen (NORCO/VICODIN) 5-325 MG tablet  Every 6 hours PRN        05/30/20 1330           Bethann Berkshire, MD 06/03/20 [phone]

## 2020-05-30 NOTE — Discharge Instructions (Addendum)
Follow up with your doctor next week for recheck or follow-up with your GI doctor.  Return if high fever vomiting or severe pain

## 2020-06-25 ENCOUNTER — Encounter (HOSPITAL_COMMUNITY): Payer: Self-pay | Admitting: Physical Therapy

## 2020-06-25 ENCOUNTER — Ambulatory Visit (HOSPITAL_COMMUNITY): Payer: 59 | Attending: Orthopaedic Surgery | Admitting: Physical Therapy

## 2020-06-25 ENCOUNTER — Other Ambulatory Visit: Payer: Self-pay

## 2020-06-25 DIAGNOSIS — R262 Difficulty in walking, not elsewhere classified: Secondary | ICD-10-CM | POA: Diagnosis present

## 2020-06-25 DIAGNOSIS — G8929 Other chronic pain: Secondary | ICD-10-CM | POA: Insufficient documentation

## 2020-06-25 DIAGNOSIS — M545 Low back pain, unspecified: Secondary | ICD-10-CM | POA: Insufficient documentation

## 2020-06-25 DIAGNOSIS — M6281 Muscle weakness (generalized): Secondary | ICD-10-CM | POA: Diagnosis present

## 2020-06-25 NOTE — Therapy (Signed)
[facility] [address] [place], [place], [address] Phone: [phone]   Fax:  [phone]  Physical Therapy Evaluation  Patient Details  Name: John Jordan MRN: [id number] Date of Birth: [birth year] Referring Provider (PT): Max Noel Gerold   Encounter Date: 06/25/2020   PT End of Session - 06/25/20 0743    Visit Number 1    Number of Visits 12    Date for PT Re-Evaluation 08/06/20    Authorization Type [employer], no auth, Visit limit 23 hard max    Authorization - Visit Number 1    Authorization - Number of Visits 23    Progress Note Due on Visit 10    PT Start Time 0745    PT Stop Time 0815    PT Time Calculation (min) 30 min    Activity Tolerance Patient limited by pain    Behavior During Therapy [facility] for tasks assessed/performed           Past Medical History:  Diagnosis Date  . Allergy    seasonal allergies  . Anxiety   . Arthritis    generalized  . Depression   . Diverticulitis   . GERD (gastroesophageal reflux disease)    on meds  . Hypertension    on meds    Past Surgical History:  Procedure Laterality Date  . COLONOSCOPY  11[facility]   [facility]  . HERNIA REPAIR Bilateral    bilateral inguinal hernia repair  . WISDOM TOOTH EXTRACTION      There were no vitals filed for this visit.    Subjective Assessment - 06/25/20 0748    Subjective Patient reports he has been having LBP x 8 months with increasing pain and reports recent visit to spinal surgeon to discuss surgical options.  Patient reports referred symptoms primarily to posterior right LE and reports parasthesias. Reports use of oral steroids and gabapentin but with little relief. Patient works full-time as Engineer, site and reports difficulty with job performance due to frequent requirement of navigating small and tight spaces    Limitations Lifting;Standing;Walking    How long can you stand comfortably? 5-10  minutes    How long can you walk comfortably? 5-10 minutes    Currently in Pain? Yes    Pain Score 7     Pain Location Back    Pain Orientation Lower    Pain Descriptors / Indicators Aching;Burning;Constant    Pain Type Chronic pain    Pain Radiating Towards RLE > LLE    Pain Onset Other (comment)   reports increased symptoms past 8 months   Pain Frequency Intermittent    Aggravating Factors  standing/walking    Pain Relieving Factors rest, change of positions              [facility] PT Assessment - 06/25/20 0001      Assessment   Medical Diagnosis LBP; spinal stenosis with claudication    Referring Provider (PT) Sharolyn Douglas    Next MD Visit at the conclusion of therapy visits      Balance Screen   Has the patient fallen in the past 6 months Yes    How many times? 2-3    Has the patient had a decrease in activity level because of a fear of falling?  No    Is the patient reluctant to leave their home because of a fear of falling?  No      Home Environment   Living  Environment Private residence    Type of Home House    Home Layout One level      Prior Function   Level of Independence Independent    Vocation Full time employment    Haematologist      Observation/Other Assessments   Observations decreased lumbar lordosis    Focus on Therapeutic Outcomes (FOTO)  31.6% function      ROM / Strength   AROM / PROM / Strength AROM;Strength      AROM   AROM Assessment Site Lumbar    Lumbar Flexion 50% limited    Lumbar Extension 75% limited    Lumbar - Right Side Bend 75% limited    Lumbar - Left Side Bend 75% limited      Strength   Strength Assessment Site Hip;Knee;Ankle    Right Hip Flexion 3+/5    Right Hip Extension 3-/5    Right Hip ABduction 3+/5    Left Hip Flexion 3+/5    Left Hip Extension 3-/5    Left Hip ABduction 3+/5      Palpation   Palpation comment hypersensitive to palpation lumbar paraspinals      Ambulation/Gait    Ambulation/Gait Yes    Ambulation/Gait Assistance 6: Modified independent (Device/Increase time)    Ambulation Distance (Feet) 180 Feet    Assistive device None    Gait Pattern Decreased stride length    Ambulation Surface Level    Gait velocity decreased    Gait Comments   reports 7/10 pain during ambulation                     Objective measurements completed on examination: See above findings.       [facility] Adult PT Treatment/Exercise - 06/25/20 0001      Exercises   Exercises Lumbar      Lumbar Exercises: Stretches   Passive Hamstring Stretch 2 reps;30 seconds   with belt   Single Knee to Chest Stretch 3 reps;30 seconds    Double Knee to Chest Stretch 1 rep;30 seconds    Other Lumbar Stretch Exercise supine 90/90 for relaxation                  PT Education - 06/25/20 0824    Education Details Pt education on HEP initiation and anatomy and physiology of lumbar spine/stenosis    Person(s) Educated Patient    Methods Explanation    Comprehension Verbalized understanding            PT Short Term Goals - 06/25/20 0836      PT SHORT TERM GOAL #1   Title Patient will report at least 25% improvement in overall symptoms and function to demonstrate overall improved functional ability    Time 3    Period Weeks    Status New    Target Date 07/16/20      PT SHORT TERM GOAL #2   Title Patient will be independent with HEP in order to improve functional outcomes.    Baseline in development    Time 3    Period Weeks    Status New    Target Date 07/16/20      PT SHORT TERM GOAL #3   Title Patient will ambulate 250 ft during with pain not exceeding 4/10    Baseline 180 ft w/ 7/10 pain    Time 3    Period Weeks    Status New  Target Date 07/16/20             PT Long Term Goals - 06/25/20 0838      PT LONG TERM GOAL #1   Title Patient will report at least 50% improvement in overall symptoms and function to demonstrate overall  improved functional ability    Time 6    Period Weeks    Status New    Target Date 08/06/20      PT LONG TERM GOAL #2   Title Patient will improve on FOTO score to meet predicted outcomes to improve functional independence    Baseline 31.6% function    Time 6    Period Weeks    Status New    Target Date 08/06/20      PT LONG TERM GOAL #3   Title Patient will demonstrate lumbar flexion WNL to improve positioning for comfort    Baseline 50% limited    Time 6    Period Weeks    Status New    Target Date 08/06/20                  Plan - 06/25/20 3244    Clinical Impression Statement Patient exhibits LBP with referred symptoms into RLE>LLE especially in standing, walking, extended positions and demonstrates poor activity tolerance.  Ambulates with decreased velocity and amplitude of movement.  Demonstrates LE and trunk weakness coupled with ROM limitations of lumbar spine and bilateral hips.  Patient requires freqent rest periods and change of position due to back and leg pain    Personal Factors and Comorbidities Comorbidity 1;Comorbidity 2;Time since onset of injury/illness/exacerbation;Profession    Comorbidities HTN, mental health issues, urinary issues requiring self-catheterization    Examination-Activity Limitations Bend;Carry;Lift;Stand;Squat;Sleep;Locomotion Level;Transfers    Examination-Participation Restrictions Community Activity;Yard Work;Occupation    Stability/Clinical Decision Making Stable/Uncomplicated    Clinical Decision Making Low    Rehab Potential Good    PT Frequency 2x / week    PT Duration 6 weeks    PT Treatment/Interventions ADLs/Self Care Home Management;Biofeedback;Cryotherapy;Electrical Stimulation;DME Instruction;Ultrasound;Traction;Moist Heat;Gait training;Stair training;Functional mobility training;Therapeutic activities;Therapeutic exercise;Balance training;Patient/family education;Neuromuscular re-education;Manual techniques;Passive range of  motion;Taping;Energy conservation;Dry needling;Spinal Manipulations;Joint Manipulations;Other (comment)    PT Next Visit Plan Focus on lumbar flexion and stretching with flexion bias. Traction?    PT Home Exercise Plan knee to chest (single and double), hamstring stretch, supine with 90/90 support    Consulted and Agree with Plan of Care Patient           Patient will benefit from skilled therapeutic intervention in order to improve the following deficits and impairments:  Abnormal gait,Decreased activity tolerance,Decreased balance,Decreased mobility,Decreased knowledge of use of DME,Decreased endurance,Decreased range of motion,Decreased strength,Difficulty walking,Impaired perceived functional ability,Impaired flexibility,Improper body mechanics,Pain  Visit Diagnosis: Chronic bilateral low back pain, unspecified whether sciatica present  Muscle weakness (generalized)  Difficulty in walking, not elsewhere classified     Problem List There are no problems to display for this patient.   8:46 AM, 06/25/20 M. Shary Decamp, PT, DPT Physical Therapist- [place] Office Number: [phone]  [facility] [address] [place], [place], [address] Phone: [phone]   Fax:  [phone]  Name: John Jordan MRN: [id number] Date of Birth: [birth year]

## 2020-07-01 ENCOUNTER — Other Ambulatory Visit: Payer: Self-pay

## 2020-07-01 ENCOUNTER — Ambulatory Visit (HOSPITAL_COMMUNITY): Payer: 59

## 2020-07-01 ENCOUNTER — Encounter (HOSPITAL_COMMUNITY): Payer: Self-pay

## 2020-07-01 DIAGNOSIS — M545 Low back pain, unspecified: Secondary | ICD-10-CM | POA: Diagnosis not present

## 2020-07-01 DIAGNOSIS — M6281 Muscle weakness (generalized): Secondary | ICD-10-CM

## 2020-07-01 DIAGNOSIS — G8929 Other chronic pain: Secondary | ICD-10-CM

## 2020-07-01 DIAGNOSIS — R262 Difficulty in walking, not elsewhere classified: Secondary | ICD-10-CM

## 2020-07-01 NOTE — Therapy (Signed)
[facility] [address] [place], [place], [address] Phone: [phone]   Fax:  [phone]  Physical Therapy Treatment  Patient Details  Name: John Jordan MRN: [id number] Date of Birth: 1960/10/12 Referring Provider (PT): Max Noel Gerold   Encounter Date: 07/01/2020   PT End of Session - 07/01/20 1826    Visit Number 2    Number of Visits 12    Date for PT Re-Evaluation 08/06/20    Authorization Type [employer], no auth, Visit limit 23 hard max    Authorization - Visit Number 2    Authorization - Number of Visits 23    Progress Note Due on Visit 10    PT Start Time 1747    PT Stop Time 1826    PT Time Calculation (min) 39 min    Activity Tolerance Patient limited by pain    Behavior During Therapy [facility] for tasks assessed/performed           Past Medical History:  Diagnosis Date  . Allergy    seasonal allergies  . Anxiety   . Arthritis    generalized  . Depression   . Diverticulitis   . GERD (gastroesophageal reflux disease)    on meds  . Hypertension    on meds    Past Surgical History:  Procedure Laterality Date  . [facility]  . HERNIA REPAIR Bilateral    bilateral inguinal hernia repair  . WISDOM TOOTH EXTRACTION      There were no vitals filed for this visit.   Subjective Assessment - 07/01/20 1750    Subjective Pt reports he is having constant aching pain in lower back and radicular symptoms down Rt LE that goes down to foot.  Has had to reduce hours with work due to pain, had reduced pain wiht resting over weekend though pain arrived 1 hr upon arrival to work.    Currently in Pain? Yes    Pain Score 7     Pain Location Back    Pain Orientation Lower    Pain Descriptors / Indicators Aching    Pain Type Chronic pain    Pain Radiating Towards Rt LE down to foot    Pain Onset --   increased symptoms in last 8 month   Pain Frequency Intermittent     Aggravating Factors  standing, walking    Pain Relieving Factors rest, change of position, forward flexion.                             [facility] Adult PT Treatment/Exercise - 07/01/20 0001      Exercises   Exercises Lumbar      Lumbar Exercises: Stretches   Passive Hamstring Stretch 2 reps;30 seconds    Passive Hamstring Stretch Limitations educated with supine behind knee, belt with opposite LE bend    Single Knee to Chest Stretch 2 reps;30 seconds    Double Knee to Chest Stretch 1 rep;30 seconds      Lumbar Exercises: Seated   Hip Flexion on Ball 5 reps    Hip Flexion on Ball Limitations 10" holds    Other Seated Lumbar Exercises marching 4x 5"                  PT Education - 07/01/20 1802    Education Details Reviewed goals, educated benefits with HEP compliance, pt able to recall  some of the exercises though did require cueing for form and encouraged to increase hold time with stretches for increased musculature lengthening.    Person(s) Educated Patient    Methods Explanation;Verbal cues    Comprehension Verbalized understanding;Returned demonstration            PT Short Term Goals - 06/25/20 0836      PT SHORT TERM GOAL #1   Title Patient will report at least 25% improvement in overall symptoms and function to demonstrate overall improved functional ability    Time 3    Period Weeks    Status New    Target Date 07/16/20      PT SHORT TERM GOAL #2   Title Patient will be independent with HEP in order to improve functional outcomes.    Baseline in development    Time 3    Period Weeks    Status New    Target Date 07/16/20      PT SHORT TERM GOAL #3   Title Patient will ambulate 250 ft during with pain not exceeding 4/10    Baseline 180 ft w/ 7/10 pain    Time 3    Period Weeks    Status New    Target Date 07/16/20             PT Long Term Goals - 06/25/20 0838      PT LONG TERM GOAL #1   Title Patient will report at  least 50% improvement in overall symptoms and function to demonstrate overall improved functional ability    Time 6    Period Weeks    Status New    Target Date 08/06/20      PT LONG TERM GOAL #2   Title Patient will improve on FOTO score to meet predicted outcomes to improve functional independence    Baseline 31.6% function    Time 6    Period Weeks    Status New    Target Date 08/06/20      PT LONG TERM GOAL #3   Title Patient will demonstrate lumbar flexion WNL to improve positioning for comfort    Baseline 50% limited    Time 6    Period Weeks    Status New    Target Date 08/06/20                 Plan - 07/01/20 1810    Clinical Impression Statement Reviewed goals, educated benefits with HEP complaince for maximal benefits with therapy.  Pt required cueing for proper form and encouraged to increase hold times for musculature lengthening with current exercise program.  Pt limited by pain scale 7/10 through session wiht reports of feeling a pull during stretches.  Pt educated on anatomy and proper lengthening as well as weakness noted during eval.    Personal Factors and Comorbidities Comorbidity 1;Comorbidity 2;Time since onset of injury/illness/exacerbation;Profession    Comorbidities HTN, mental health issues, urinary issues requiring self-catheterization    Examination-Activity Limitations Bend;Carry;Lift;Stand;Squat;Sleep;Locomotion Level;Transfers    Examination-Participation Restrictions Community Activity;Yard Work;Occupation    Stability/Clinical Decision Making Stable/Uncomplicated    Clinical Decision Making Low    Rehab Potential Good    PT Frequency 2x / week    PT Duration 6 weeks    PT Treatment/Interventions ADLs/Self Care Home Management;Biofeedback;Cryotherapy;Electrical Stimulation;DME Instruction;Ultrasound;Traction;Moist Heat;Gait training;Stair training;Functional mobility training;Therapeutic activities;Therapeutic exercise;Balance  training;Patient/family education;Neuromuscular re-education;Manual techniques;Passive range of motion;Taping;Energy conservation;Dry needling;Spinal Manipulations;Joint Manipulations;Other (comment)    PT Next Visit Plan Focus  on lumbar flexion and stretching with flexion bias. Traction?    PT Home Exercise Plan knee to chest (single and double), hamstring stretch, supine with 90/90 support           Patient will benefit from skilled therapeutic intervention in order to improve the following deficits and impairments:  Abnormal gait,Decreased activity tolerance,Decreased balance,Decreased mobility,Decreased knowledge of use of DME,Decreased endurance,Decreased range of motion,Decreased strength,Difficulty walking,Impaired perceived functional ability,Impaired flexibility,Improper body mechanics,Pain  Visit Diagnosis: Chronic bilateral low back pain, unspecified whether sciatica present  Muscle weakness (generalized)  Difficulty in walking, not elsewhere classified     Problem List There are no problems to display for this patient.  Becky Sax, LPTA/CLT; CBIS [phone]  Juel Burrow 07/01/2020, 6:28 PM  [facility] [address] [place], [place], [address] Phone: [phone]   Fax:  [phone]  Name: Jese Comella MRN: [id number] Date of Birth: Dec 05, 1960

## 2020-07-08 ENCOUNTER — Telehealth (HOSPITAL_COMMUNITY): Payer: Self-pay

## 2020-07-08 ENCOUNTER — Ambulatory Visit (HOSPITAL_COMMUNITY): Payer: 59

## 2020-07-08 NOTE — Telephone Encounter (Signed)
pt's wife called to cx both appts due to the passing of his son

## 2020-07-10 ENCOUNTER — Encounter (HOSPITAL_COMMUNITY): Payer: No Typology Code available for payment source

## 2020-07-15 ENCOUNTER — Encounter (HOSPITAL_COMMUNITY): Payer: Self-pay | Admitting: Physical Therapy

## 2020-07-15 ENCOUNTER — Ambulatory Visit (HOSPITAL_COMMUNITY): Payer: 59 | Attending: Orthopaedic Surgery | Admitting: Physical Therapy

## 2020-07-15 ENCOUNTER — Other Ambulatory Visit: Payer: Self-pay

## 2020-07-15 DIAGNOSIS — G8929 Other chronic pain: Secondary | ICD-10-CM | POA: Diagnosis present

## 2020-07-15 DIAGNOSIS — R262 Difficulty in walking, not elsewhere classified: Secondary | ICD-10-CM | POA: Diagnosis present

## 2020-07-15 DIAGNOSIS — M6281 Muscle weakness (generalized): Secondary | ICD-10-CM | POA: Diagnosis present

## 2020-07-15 DIAGNOSIS — M545 Low back pain, unspecified: Secondary | ICD-10-CM | POA: Diagnosis not present

## 2020-07-15 NOTE — Therapy (Signed)
[facility] [address] [place], [place], [address] Phone: [phone]   Fax:  [phone]  Physical Therapy Treatment  Patient Details  Name: John Jordan MRN: [id number] Date of Birth: 1961/04/17 Referring Provider (PT): Max Noel Gerold   Encounter Date: 07/15/2020   PT End of Session - 07/15/20 0746    Visit Number 3    Number of Visits 12    Date for PT Re-Evaluation 08/06/20    Authorization Type [employer], no auth, Visit limit 23 hard max    Authorization - Visit Number 3    Authorization - Number of Visits 23    Progress Note Due on Visit 10    PT Start Time 0745    PT Stop Time 0823    PT Time Calculation (min) 38 min    Activity Tolerance Patient limited by pain    Behavior During Therapy [facility] for tasks assessed/performed           Past Medical History:  Diagnosis Date  . Allergy    seasonal allergies  . Anxiety   . Arthritis    generalized  . Depression   . Diverticulitis   . GERD (gastroesophageal reflux disease)    on meds  . Hypertension    on meds    Past Surgical History:  Procedure Laterality Date  . [facility]  [facility]   [facility]  . HERNIA REPAIR Bilateral    bilateral inguinal hernia repair  . WISDOM TOOTH EXTRACTION      There were no vitals filed for this visit.   Subjective Assessment - 07/15/20 0753    Subjective States that he has tried his exercises but he doesn't think any amount of therapy will help with his back. Current pain level is 8/10 and is in both sides in hips and legs and going all the way down the legs. States when he moves a certain way he feels a pop. States that leaning over the cart he felt a little better. States that when he is driving it really bothers him.    Currently in Pain? Yes    Pain Score 8     Pain Location Back    Pain Orientation Lower    Pain Descriptors / Indicators Aching    Pain Type Chronic pain    Pain Onset  --   increased symptoms in last 8 month             [facility] PT Assessment - 07/15/20 0001      Assessment   Medical Diagnosis LBP; spinal stenosis with claudication    Referring Provider (PT) Max Noel Gerold                         [facility] Adult PT Treatment/Exercise - 07/15/20 0001      Lumbar Exercises: Seated   Other Seated Lumbar Exercises self traction in chair x10 5-10" holds, lumbar roll in seated position - 3 minutes; deep breathing with long exhale x5 --> standing long exhale x5 - WB position more painful; gentle trunk rotation with breathing 4x2 B    Other Seated Lumbar Exercises lumbar flexion with ball assist 3 minutes - timed with breathing      Lumbar Exercises: Supine   Other Supine Lumbar Exercises deep breathing exercises focus on long exhale - 6 minutes total    Other Supine Lumbar Exercises 90/90 support - not tolerated      Modalities  Modalities Moist Heat      Moist Heat Therapy   Number Minutes Moist Heat 15 Minutes   during supine interventions   Moist Heat Location Lumbar Spine                  PT Education - 07/15/20 0754    Education Details educated patient on lumbar traction and lumbar support. answered all questions. on breathing, intra-abdominal pressure and how it can change symptoms and pressur    Person(s) Educated Patient    Methods Explanation    Comprehension Verbalized understanding            PT Short Term Goals - 06/25/20 0836      PT SHORT TERM GOAL #1   Title Patient will report at least 25% improvement in overall symptoms and function to demonstrate overall improved functional ability    Time 3    Period Weeks    Status New    Target Date 07/16/20      PT SHORT TERM GOAL #2   Title Patient will be independent with HEP in order to improve functional outcomes.    Baseline in development    Time 3    Period Weeks    Status New    Target Date 07/16/20      PT SHORT TERM GOAL #3   Title Patient will  ambulate 250 ft during with pain not exceeding 4/10    Baseline 180 ft w/ 7/10 pain    Time 3    Period Weeks    Status New    Target Date 07/16/20             PT Long Term Goals - 06/25/20 0838      PT LONG TERM GOAL #1   Title Patient will report at least 50% improvement in overall symptoms and function to demonstrate overall improved functional ability    Time 6    Period Weeks    Status New    Target Date 08/06/20      PT LONG TERM GOAL #2   Title Patient will improve on FOTO score to meet predicted outcomes to improve functional independence    Baseline 31.6% function    Time 6    Period Weeks    Status New    Target Date 08/06/20      PT LONG TERM GOAL #3   Title Patient will demonstrate lumbar flexion WNL to improve positioning for comfort    Baseline 50% limited    Time 6    Period Weeks    Status New    Target Date 08/06/20                 Plan - 07/15/20 0747    Clinical Impression Statement Continued to focus on education and pain management strategies. Minimal pain relief noted but change in symptoms with long exhale breathing techniques. Reduced intensity of symptoms noted but returned to base line with transition back to upright position. Will continue to trial different pain managements strategies and work on core and breath work as tolerated.    Personal Factors and Comorbidities Comorbidity 1;Comorbidity 2;Time since onset of injury/illness/exacerbation;Profession    Comorbidities HTN, mental health issues, urinary issues requiring self-catheterization    Examination-Activity Limitations Bend;Carry;Lift;Stand;Squat;Sleep;Locomotion Level;Transfers    Examination-Participation Restrictions Community Activity;Yard Work;Occupation    Stability/Clinical Decision Making Stable/Uncomplicated    Rehab Potential Good    PT Frequency 2x / week    PT Duration  6 weeks    PT Treatment/Interventions ADLs/Self Care Home  Management;Biofeedback;Cryotherapy;Electrical Stimulation;DME Instruction;Ultrasound;Traction;Moist Heat;Gait training;Stair training;Functional mobility training;Therapeutic activities;Therapeutic exercise;Balance training;Patient/family education;Neuromuscular re-education;Manual techniques;Passive range of motion;Taping;Energy conservation;Dry needling;Spinal Manipulations;Joint Manipulations;Other (comment)    PT Next Visit Plan Focus on lumbar flexion and stretching with flexion bias. Traction?, heat, core activation, TENS    PT Home Exercise Plan knee to chest (single and double), hamstring stretch, supine with 90/90 support; 1/11 long exhale, seated lumbar support           Patient will benefit from skilled therapeutic intervention in order to improve the following deficits and impairments:  Abnormal gait,Decreased activity tolerance,Decreased balance,Decreased mobility,Decreased knowledge of use of DME,Decreased endurance,Decreased range of motion,Decreased strength,Difficulty walking,Impaired perceived functional ability,Impaired flexibility,Improper body mechanics,Pain  Visit Diagnosis: Chronic bilateral low back pain, unspecified whether sciatica present  Muscle weakness (generalized)  Difficulty in walking, not elsewhere classified     Problem List There are no problems to display for this patient.   8:26 AM, 07/15/20 Tereasa Coop, DPT Physical Therapy with [facility]  [phone] office  [facility] [address] [place], [place], [address] Phone: [phone]   Fax:  [phone]  Name: John Jordan MRN: [id number] Date of Birth: May 31, 1961

## 2020-07-17 ENCOUNTER — Ambulatory Visit (HOSPITAL_COMMUNITY): Payer: 59 | Admitting: Physical Therapy

## 2020-07-17 ENCOUNTER — Other Ambulatory Visit: Payer: Self-pay

## 2020-07-17 ENCOUNTER — Encounter (HOSPITAL_COMMUNITY): Payer: Self-pay | Admitting: Physical Therapy

## 2020-07-17 DIAGNOSIS — M6281 Muscle weakness (generalized): Secondary | ICD-10-CM

## 2020-07-17 DIAGNOSIS — G8929 Other chronic pain: Secondary | ICD-10-CM

## 2020-07-17 DIAGNOSIS — M545 Low back pain, unspecified: Secondary | ICD-10-CM

## 2020-07-17 DIAGNOSIS — R262 Difficulty in walking, not elsewhere classified: Secondary | ICD-10-CM

## 2020-07-17 NOTE — Therapy (Signed)
[facility] [address] [place], [place], [address] Phone: [phone]   Fax:  [phone]  Physical Therapy Treatment  Patient Details  Name: John Jordan MRN: [id number] Date of Birth: December 31, 1960 Referring Provider (PT): Max Noel Gerold   Encounter Date: 07/17/2020   PT End of Session - 07/17/20 0743    Visit Number 4    Number of Visits 12    Date for PT Re-Evaluation 08/06/20    Authorization Type [employer], no auth, Visit limit 23 hard max    Authorization - Visit Number 4    Authorization - Number of Visits 23    Progress Note Due on Visit 10    PT Start Time 0745    PT Stop Time 0819    PT Time Calculation (min) 34 min    Activity Tolerance Patient limited by pain    Behavior During Therapy [facility] for tasks assessed/performed           Past Medical History:  Diagnosis Date  . Allergy    seasonal allergies  . Anxiety   . Arthritis    generalized  . Depression   . Diverticulitis   . GERD (gastroesophageal reflux disease)    on meds  . Hypertension    on meds    Past Surgical History:  Procedure Laterality Date  . COLONOSCOPY  11[facility]2019   [facility]  . HERNIA REPAIR Bilateral    bilateral inguinal hernia repair  . WISDOM TOOTH EXTRACTION      There were no vitals filed for this visit.   Subjective Assessment - 07/17/20 0747    Subjective Towel roll somewhat helpful. Current pain 7/10. States he just woke up and is just getting moving for the day.    Currently in Pain? Yes    Pain Score 7     Pain Location Back    Pain Orientation Lower    Pain Descriptors / Indicators Aching    Pain Type Chronic pain    Pain Radiating Towards Rt LE, glutes B    Pain Onset --   increased symptoms in last 8 month                            [facility] Adult PT Treatment/Exercise - 07/17/20 0001      Ambulation/Gait   Gait Comments trialed backward walking, large increase  in shooting pain, did not complete      Lumbar Exercises: Stretches   Hip Flexor Stretch 10 seconds;5 reps;Right;Left   staggered stance, glute squeeze with trunk extension. Increased pain, discussed adjusting foot stance and decreasing extension.   Piriformis Stretch Right;Left;5 reps   5 sec, seated     Lumbar Exercises: Standing   Other Standing Lumbar Exercises lateral walks x5 x10 ft, verbal cues for glute squeezes      Lumbar Exercises: Seated   Other Seated Lumbar Exercises seated hip IR, 5 sec holds, x10 B, verbal cues for breathing      Lumbar Exercises: Supine   Glut Set 15 reps;5 seconds   2 sets   Other Supine Lumbar Exercises Traction: with green ball pulling LEs. Trial heel pushes into ball, increase pain. Trunk rotation with ball x2-3 min.    Other Supine Lumbar Exercises deep breathing exercises focus on long exhale - 2 minutes total  PT Short Term Goals - 06/25/20 0836      PT SHORT TERM GOAL #1   Title Patient will report at least 25% improvement in overall symptoms and function to demonstrate overall improved functional ability    Time 3    Period Weeks    Status New    Target Date 07/16/20      PT SHORT TERM GOAL #2   Title Patient will be independent with HEP in order to improve functional outcomes.    Baseline in development    Time 3    Period Weeks    Status New    Target Date 07/16/20      PT SHORT TERM GOAL #3   Title Patient will ambulate 250 ft during with pain not exceeding 4/10    Baseline 180 ft w/ 7/10 pain    Time 3    Period Weeks    Status New    Target Date 07/16/20             PT Long Term Goals - 06/25/20 0838      PT LONG TERM GOAL #1   Title Patient will report at least 50% improvement in overall symptoms and function to demonstrate overall improved functional ability    Time 6    Period Weeks    Status New    Target Date 08/06/20      PT LONG TERM GOAL #2   Title Patient will improve  on FOTO score to meet predicted outcomes to improve functional independence    Baseline 31.6% function    Time 6    Period Weeks    Status New    Target Date 08/06/20      PT LONG TERM GOAL #3   Title Patient will demonstrate lumbar flexion WNL to improve positioning for comfort    Baseline 50% limited    Time 6    Period Weeks    Status New    Target Date 08/06/20                 Plan - 07/17/20 0815    Clinical Impression Statement Demos consistent pain throughout session and limited mobility in lumbar spine and hip ROM impacting mobility. Increased difficulty with 90-90 over ball in supine secondary to increase in sciatic pain. Continues to demonstrate preference for holding breath throughout exercises impacting pain, educated on breathing throughout exercises and for benefit of pain. Large increase in shooting pain with backwards walking. Decreased POC to 1x/wk per patient request. Ended session early secondary to pain.    Personal Factors and Comorbidities Comorbidity 1;Comorbidity 2;Time since onset of injury/illness/exacerbation;Profession    Comorbidities HTN, mental health issues, urinary issues requiring self-catheterization    Examination-Activity Limitations Bend;Carry;Lift;Stand;Squat;Sleep;Locomotion Level;Transfers    Examination-Participation Restrictions Community Activity;Yard Work;Occupation    Stability/Clinical Decision Making Stable/Uncomplicated    Rehab Potential Good    PT Frequency 2x / week    PT Duration 6 weeks    PT Treatment/Interventions ADLs/Self Care Home Management;Biofeedback;Cryotherapy;Electrical Stimulation;DME Instruction;Ultrasound;Traction;Moist Heat;Gait training;Stair training;Functional mobility training;Therapeutic activities;Therapeutic exercise;Balance training;Patient/family education;Neuromuscular re-education;Manual techniques;Passive range of motion;Taping;Energy conservation;Dry needling;Spinal Manipulations;Joint  Manipulations;Other (comment)    PT Next Visit Plan Focus on Hip mobility. lumbar flexion and stretching with flexion bias. Traction?, heat, core activation, TENS.    PT Home Exercise Plan knee to chest (single and double), hamstring stretch, supine with 90/90 support; 1/11 long exhale, seated lumbar support, 1/13 hip fall ins, lateral walks  Patient will benefit from skilled therapeutic intervention in order to improve the following deficits and impairments:  Abnormal gait,Decreased activity tolerance,Decreased balance,Decreased mobility,Decreased knowledge of use of DME,Decreased endurance,Decreased range of motion,Decreased strength,Difficulty walking,Impaired perceived functional ability,Impaired flexibility,Improper body mechanics,Pain  Visit Diagnosis: Chronic bilateral low back pain, unspecified whether sciatica present  Muscle weakness (generalized)  Difficulty in walking, not elsewhere classified     Problem List There are no problems to display for this patient.  8:29 AM,07/17/20 Esmeralda Links, PT, DPT Physical Therapist at [facility] [address] [place], [place], [address] Phone: [phone]   Fax:  [phone]  Name: John Jordan MRN: [id number] Date of Birth: 02/04/1961

## 2020-07-22 ENCOUNTER — Other Ambulatory Visit: Payer: Self-pay

## 2020-07-22 ENCOUNTER — Ambulatory Visit (HOSPITAL_COMMUNITY): Payer: 59

## 2020-07-22 ENCOUNTER — Telehealth (HOSPITAL_COMMUNITY): Payer: Self-pay

## 2020-07-22 ENCOUNTER — Encounter (HOSPITAL_COMMUNITY): Payer: Self-pay

## 2020-07-22 DIAGNOSIS — M545 Low back pain, unspecified: Secondary | ICD-10-CM | POA: Diagnosis not present

## 2020-07-22 DIAGNOSIS — M6281 Muscle weakness (generalized): Secondary | ICD-10-CM

## 2020-07-22 DIAGNOSIS — G8929 Other chronic pain: Secondary | ICD-10-CM

## 2020-07-22 DIAGNOSIS — R262 Difficulty in walking, not elsewhere classified: Secondary | ICD-10-CM

## 2020-07-22 NOTE — Therapy (Signed)
[facility] [address] [place], [place], [address] Phone: [phone]   Fax:  [phone]  Physical Therapy Treatment  Patient Details  Name: John Jordan MRN: [id number] Date of Birth: February 06, 1961 Referring Provider (PT): Max Noel Gerold   Encounter Date: 07/22/2020   PT End of Session - 07/22/20 1729    Visit Number 5    Number of Visits 12    Date for PT Re-Evaluation 08/06/20    Authorization Type [employer], no auth, Visit limit 23 hard max    Authorization - Visit Number 5    Authorization - Number of Visits 23    Progress Note Due on Visit 10    PT Start Time 1640    PT Stop Time 1720    PT Time Calculation (min) 40 min    Activity Tolerance Patient limited by pain    Behavior During Therapy [facility] for tasks assessed/performed           Past Medical History:  Diagnosis Date  . Allergy    seasonal allergies  . Anxiety   . Arthritis    generalized  . Depression   . Diverticulitis   . GERD (gastroesophageal reflux disease)    on meds  . Hypertension    on meds    Past Surgical History:  Procedure Laterality Date  . [facility]  [facility]  . HERNIA REPAIR Bilateral    bilateral inguinal hernia repair  . WISDOM TOOTH EXTRACTION      There were no vitals filed for this visit.   Subjective Assessment - 07/22/20 1642    Subjective Patient reports continuation of LBP and onset of RLE after he has been at work/walking for an hour.  Reports some relief with flexion-biased stretching but nothing of lasting effect.  Patient reports he has been very limited in his work capacity due to LBP and requires frequent time off.    Currently in Pain? Yes    Pain Score 8     Pain Location Back    Pain Orientation Lower    Pain Descriptors / Indicators Aching    Pain Type Chronic pain    Pain Onset --   increased symptoms in last 8 month   Aggravating Factors  standing, walking               [facility] PT Assessment - 07/22/20 0001      Assessment   Medical Diagnosis LBP; spinal stenosis with claudication    Referring Provider (PT) Max Noel Gerold                         [facility] Adult PT Treatment/Exercise - 07/22/20 0001      Lumbar Exercises: Stretches   Passive Hamstring Stretch 3 reps;60 seconds;Right    Single Knee to Chest Stretch 3 reps;30 seconds    Double Knee to Chest Stretch 3 reps;30 seconds    Other Lumbar Stretch Exercise Hooklying Isometric Hip Flexion with Opposite Arm   3x10, 2 sec hold     Lumbar Exercises: Aerobic   Stationary Bike level 2 x 5 min, poorly tolerated      Lumbar Exercises: Quadruped   Other Quadruped Lumbar Exercises child's pose stretch                  PT Education - 07/22/20 1728    Education Details education on benefits of core strength to provide stability  to spine despite degenerative changes.  Educated on use of inversion table for traction for long-term managment and discussed relative precautions in its use, e.g. HTN.  Demonstrated infared heating pad for purchase on internet to provide comfort at home    Person(s) Educated Patient    Methods Explanation    Comprehension Verbalized understanding            PT Short Term Goals - 06/25/20 0836      PT SHORT TERM GOAL #1   Title Patient will report at least 25% improvement in overall symptoms and function to demonstrate overall improved functional ability    Time 3    Period Weeks    Status New    Target Date 07/16/20      PT SHORT TERM GOAL #2   Title Patient will be independent with HEP in order to improve functional outcomes.    Baseline in development    Time 3    Period Weeks    Status New    Target Date 07/16/20      PT SHORT TERM GOAL #3   Title Patient will ambulate 250 ft during with pain not exceeding 4/10    Baseline 180 ft w/ 7/10 pain    Time 3    Period Weeks    Status New    Target Date 07/16/20              PT Long Term Goals - 06/25/20 0838      PT LONG TERM GOAL #1   Title Patient will report at least 50% improvement in overall symptoms and function to demonstrate overall improved functional ability    Time 6    Period Weeks    Status New    Target Date 08/06/20      PT LONG TERM GOAL #2   Title Patient will improve on FOTO score to meet predicted outcomes to improve functional independence    Baseline 31.6% function    Time 6    Period Weeks    Status New    Target Date 08/06/20      PT LONG TERM GOAL #3   Title Patient will demonstrate lumbar flexion WNL to improve positioning for comfort    Baseline 50% limited    Time 6    Period Weeks    Status New    Target Date 08/06/20                 Plan - 07/22/20 1730    Clinical Impression Statement Patient reports continued LBP with symptoms radiating to RLE causing increased difficulty performing his routine work duties and reports pattern consistent with onset of pain based on extension-biased movements.  Patient reports  transient relief with flexion-biased maneuvers and positions but nothing that provides sustained relief.  Continues to exhibit poor activity tolerance requiring frequent rest periods and position changes due to back pain    Personal Factors and Comorbidities Comorbidity 1;Comorbidity 2;Time since onset of injury/illness/exacerbation;Profession    Comorbidities HTN, mental health issues, urinary issues requiring self-catheterization    Examination-Activity Limitations Bend;Carry;Lift;Stand;Squat;Sleep;Locomotion Level;Transfers    Examination-Participation Restrictions Community Activity;Yard Work;Occupation    Stability/Clinical Decision Making Stable/Uncomplicated    Rehab Potential Good    PT Frequency 2x / week    PT Duration 6 weeks    PT Treatment/Interventions ADLs/Self Care Home Management;Biofeedback;Cryotherapy;Electrical Stimulation;DME Instruction;Ultrasound;Traction;Moist Heat;Gait  training;Stair training;Functional mobility training;Therapeutic activities;Therapeutic exercise;Balance training;Patient/family education;Neuromuscular re-education;Manual techniques;Passive range of motion;Taping;Energy conservation;Dry needling;Spinal Manipulations;Joint Manipulations;Other (  comment)    PT Next Visit Plan Focus on Hip mobility. lumbar flexion and stretching with flexion bias. Traction?, heat, core activation, TENS.    PT Home Exercise Plan knee to chest (single and double), hamstring stretch, supine with 90/90 support; 1/11 long exhale, seated lumbar support, 1/13 hip fall ins, lateral walks           Patient will benefit from skilled therapeutic intervention in order to improve the following deficits and impairments:  Abnormal gait,Decreased activity tolerance,Decreased balance,Decreased mobility,Decreased knowledge of use of DME,Decreased endurance,Decreased range of motion,Decreased strength,Difficulty walking,Impaired perceived functional ability,Impaired flexibility,Improper body mechanics,Pain  Visit Diagnosis: Chronic bilateral low back pain, unspecified whether sciatica present  Muscle weakness (generalized)  Difficulty in walking, not elsewhere classified     Problem List There are no problems to display for this patient.   5:36 PM, 07/22/20 M. Shary Decamp, PT, DPT Physical Therapist- [place] Office Number: [phone]  [facility] [address] [place], [place], [address] Phone: [phone]   Fax:  [phone]  Name: John Jordan MRN: [id number] Date of Birth: Jan 16, 1961

## 2020-07-24 ENCOUNTER — Encounter (HOSPITAL_COMMUNITY): Payer: No Typology Code available for payment source | Admitting: Physical Therapy

## 2020-07-29 ENCOUNTER — Ambulatory Visit (HOSPITAL_COMMUNITY): Payer: 59 | Admitting: Physical Therapy

## 2020-07-29 ENCOUNTER — Encounter (HOSPITAL_COMMUNITY): Payer: Self-pay | Admitting: Physical Therapy

## 2020-07-29 ENCOUNTER — Other Ambulatory Visit: Payer: Self-pay

## 2020-07-29 DIAGNOSIS — M545 Low back pain, unspecified: Secondary | ICD-10-CM | POA: Diagnosis not present

## 2020-07-29 DIAGNOSIS — R262 Difficulty in walking, not elsewhere classified: Secondary | ICD-10-CM

## 2020-07-29 DIAGNOSIS — G8929 Other chronic pain: Secondary | ICD-10-CM

## 2020-07-29 DIAGNOSIS — M6281 Muscle weakness (generalized): Secondary | ICD-10-CM

## 2020-07-29 NOTE — Therapy (Addendum)
[facility] [address] [place], [place], [address] Phone: [phone]   Fax:  [phone]  Physical Therapy Treatment and Progress Note and Discharge Note  Patient Details  Name: John Jordan MRN: [id number] Date of Birth: [birth year] Referring Provider (PT): Max Patrice Paradise  PHYSICAL THERAPY DISCHARGE SUMMARY  Visits from Start of Care: 6  Current functional level related to goals / functional outcomes: See below   Remaining deficits: See below   Education / Equipment: See below  Plan: Patient agrees to discharge.  Patient goals were not met. Patient is being discharged due to the patient's request.  ?????         7:54 AM, 08/04/20 Jerene Pitch, DPT Physical Therapy with [facility]  [phone] office    Progress Note Reporting Period 06/25/20  to 07/29/20  See note below for Objective Data and Assessment of Progress/Goals.      Encounter Date: 07/29/2020   PT End of Session - 07/29/20 1613    Visit Number 6    Number of Visits 12    Date for PT Re-Evaluation 08/06/20    Authorization Type [employer], no auth, Visit limit 23 hard max    Authorization - Visit Number 6    Authorization - Number of Visits 23    Progress Note Due on Visit 16    PT Start Time 6073    PT Stop Time 7106    PT Time Calculation (min) 32 min    Activity Tolerance Patient limited by pain    Behavior During Therapy WFL for tasks assessed/performed           Past Medical History:  Diagnosis Date  . Allergy    seasonal allergies  . Anxiety   . Arthritis    generalized  . Depression   . Diverticulitis   . GERD (gastroesophageal reflux disease)    on meds  . Hypertension    on meds    Past Surgical History:  Procedure Laterality Date  . [facility]  [facility]   [facility]  . HERNIA REPAIR Bilateral    bilateral inguinal hernia repair  . WISDOM TOOTH EXTRACTION       There were no vitals filed for this visit.   Subjective Assessment - 07/29/20 1621    Subjective States that current pain level is 9/10 had a busy day at work. States that pain is a constant ache/throbbing. States that his leg is also a constant ache.    Currently in Pain? Yes    Pain Score 9     Pain Location Back    Pain Orientation Lower    Pain Descriptors / Indicators Aching;Constant    Pain Radiating Towards right lower extremity    Pain Onset --   increased symptoms in last 8 month             [facility] PT Assessment - 07/29/20 0001      Assessment   Medical Diagnosis LBP; spinal stenosis with claudication    Referring Provider (PT) Max Cohen      Observation/Other Assessments   Focus on Therapeutic Outcomes (FOTO)  24% function   was 32% function, predicted 47%     AROM   Lumbar Flexion 100% limited   painin back and right leg   Lumbar Extension 75% limited   painin low back   Lumbar - Right Side Bend 75% limited   pain in back and on left buttocks  Lumbar - Left Side Bend 75% limited   pain in back and in right leg   Lumbar - Right Rotation 75% limited   pain in back   Lumbar - Left Rotation 75%limited   pain in back     Ambulation/Gait   Ambulation/Gait Yes    Ambulation/Gait Assistance 6: Modified independent (Device/Increase time)    Ambulation Distance (Feet) 176 Feet    Assistive device None    Gait Pattern Decreased stride length    Ambulation Surface Level;Indoor    Gait velocity decreased    Gait Comments 2MW                         [facility] Adult PT Treatment/Exercise - 07/29/20 0001      Moist Heat Therapy   Number Minutes Moist Heat 15 Minutes    Moist Heat Location Lumbar Spine                  PT Education - 07/29/20 1648    Education Details current presentation. FOTO score, plan and benefits of continued strengthening/mobility work    [employer]) Educated Patient    Methods Explanation    Comprehension Verbalized  understanding            PT Short Term Goals - 07/29/20 1622      PT SHORT TERM GOAL #1   Title Patient will report at least 25% improvement in overall symptoms and function to demonstrate overall improved functional ability    Baseline 0% better    Time 3    Period Weeks    Status On-going    Target Date 07/16/20      PT SHORT TERM GOAL #2   Title Patient will be independent with HEP in order to improve functional outcomes.    Baseline in development    Time 3    Period Weeks    Status Achieved    Target Date 07/16/20      PT SHORT TERM GOAL #3   Title Patient will ambulate 250 ft during 2MWT with pain not exceeding 4/10    Baseline --    Time 3    Period Weeks    Status On-going    Target Date 07/16/20             PT Long Term Goals - 07/29/20 1622      PT LONG TERM GOAL #1   Title Patient will report at least 50% improvement in overall symptoms and function to demonstrate overall improved functional ability    Baseline 0% better    Time 6    Period Weeks    Status On-going      PT LONG TERM GOAL #2   Title Patient will improve on FOTO score to meet predicted outcomes to improve functional independence    Baseline current 24% function (predicted 47% function)    Time 6    Period Weeks    Status On-going      PT LONG TERM GOAL #3   Title Patient will demonstrate lumbar flexion WNL to improve positioning for comfort    Baseline --    Time 6    Period Weeks    Status On-going                 Plan - 07/29/20 1613    Clinical Impression Statement Patient present for a progress note on this date. Minimal progress has been made and patient demonstrated  reduced lumbar ROM and reduced FOTO score on this date. To note, initial testing was performed in the morning and today's testing was performed at the end of the day after working which usually provokes patient's symptoms. Session ended early secondary to patient tolerance to interventions. Patient to  follow up with MD this Thursday, instructed patient to call if he is to cancel Monday's appointment following that visit. Educated patient on how mobility and strengthening exercise would continue to be helpful with current symptoms. Answered all questions at this time.    Personal Factors and Comorbidities Comorbidity 1;Comorbidity 2;Time since onset of injury/illness/exacerbation;Profession    Comorbidities HTN, mental health issues, urinary issues requiring self-catheterization    Examination-Activity Limitations Bend;Carry;Lift;Stand;Squat;Sleep;Locomotion Level;Transfers    Examination-Participation Restrictions Community Activity;Yard Work;Occupation    Stability/Clinical Decision Making Stable/Uncomplicated    Rehab Potential Good    PT Frequency 2x / week    PT Duration 6 weeks    PT Treatment/Interventions ADLs/Self Care Home Management;Biofeedback;Cryotherapy;Electrical Stimulation;DME Instruction;Ultrasound;Traction;Moist Heat;Gait training;Stair training;Functional mobility training;Therapeutic activities;Therapeutic exercise;Balance training;Patient/family education;Neuromuscular re-education;Manual techniques;Passive range of motion;Taping;Energy conservation;Dry needling;Spinal Manipulations;Joint Manipulations;Other (comment)    PT Next Visit Plan f/u with MD visit. Focus on Hip mobility. lumbar flexion and stretching with flexion bias. Traction?, heat, core activation, TENS.    PT Home Exercise Plan knee to chest (single and double), hamstring stretch, supine with 90/90 support; 1/11 long exhale, seated lumbar support, 1/13 hip fall ins, lateral walks           Patient will benefit from skilled therapeutic intervention in order to improve the following deficits and impairments:  Abnormal gait,Decreased activity tolerance,Decreased balance,Decreased mobility,Decreased knowledge of use of DME,Decreased endurance,Decreased range of motion,Decreased strength,Difficulty walking,Impaired  perceived functional ability,Impaired flexibility,Improper body mechanics,Pain  Visit Diagnosis: Chronic bilateral low back pain, unspecified whether sciatica present  Muscle weakness (generalized)  Difficulty in walking, not elsewhere classified     Problem List There are no problems to display for this patient.   4:50 PM, 07/29/20 Jerene Pitch, DPT Physical Therapy with [facility]  [phone] office  [place] [address] [place], [place], [address] Phone: [phone]   Fax:  [phone]  Name: John Jordan MRN: [id number] Date of Birth: Dec 09, 1960

## 2020-07-31 ENCOUNTER — Encounter (HOSPITAL_COMMUNITY): Payer: No Typology Code available for payment source

## 2020-08-04 ENCOUNTER — Encounter (HOSPITAL_COMMUNITY): Payer: Self-pay

## 2020-08-04 ENCOUNTER — Other Ambulatory Visit: Payer: Self-pay

## 2020-08-04 ENCOUNTER — Ambulatory Visit (HOSPITAL_COMMUNITY): Payer: 59 | Admitting: Physical Therapy

## 2020-08-07 ENCOUNTER — Encounter (HOSPITAL_COMMUNITY): Payer: No Typology Code available for payment source

## 2021-01-01 ENCOUNTER — Other Ambulatory Visit: Payer: Self-pay | Admitting: Orthopedic Surgery

## 2021-01-01 DIAGNOSIS — M48062 Spinal stenosis, lumbar region with neurogenic claudication: Secondary | ICD-10-CM

## 2021-01-08 ENCOUNTER — Other Ambulatory Visit: Payer: Self-pay | Admitting: Orthopedic Surgery

## 2021-01-08 DIAGNOSIS — Z77018 Contact with and (suspected) exposure to other hazardous metals: Secondary | ICD-10-CM

## 2021-01-26 ENCOUNTER — Other Ambulatory Visit: Payer: Self-pay

## 2021-01-26 ENCOUNTER — Ambulatory Visit
Admission: RE | Admit: 2021-01-26 | Discharge: 2021-01-26 | Disposition: A | Discharge status: Home / Self Care | Payer: No Typology Code available for payment source | Source: Ambulatory Visit | Attending: Orthopedic Surgery | Admitting: Orthopedic Surgery

## 2021-01-26 DIAGNOSIS — M48062 Spinal stenosis, lumbar region with neurogenic claudication: Secondary | ICD-10-CM

## 2021-01-26 DIAGNOSIS — Z77018 Contact with and (suspected) exposure to other hazardous metals: Secondary | ICD-10-CM

## 2021-05-14 DIAGNOSIS — M48062 Spinal stenosis, lumbar region with neurogenic claudication: Secondary | ICD-10-CM | POA: Insufficient documentation

## 2021-06-16 DIAGNOSIS — N3090 Cystitis, unspecified without hematuria: Secondary | ICD-10-CM | POA: Insufficient documentation

## 2021-07-27 ENCOUNTER — Encounter (HOSPITAL_COMMUNITY): Payer: Self-pay | Admitting: Physical Therapy

## 2021-07-27 ENCOUNTER — Other Ambulatory Visit: Payer: Self-pay

## 2021-07-27 ENCOUNTER — Ambulatory Visit (HOSPITAL_COMMUNITY): Payer: No Typology Code available for payment source | Attending: Surgical | Admitting: Physical Therapy

## 2021-07-27 DIAGNOSIS — Z981 Arthrodesis status: Secondary | ICD-10-CM

## 2021-07-27 DIAGNOSIS — M6281 Muscle weakness (generalized): Secondary | ICD-10-CM

## 2021-07-27 DIAGNOSIS — M545 Low back pain, unspecified: Secondary | ICD-10-CM | POA: Insufficient documentation

## 2021-07-27 DIAGNOSIS — R262 Difficulty in walking, not elsewhere classified: Secondary | ICD-10-CM | POA: Diagnosis present

## 2021-07-27 DIAGNOSIS — G8929 Other chronic pain: Secondary | ICD-10-CM | POA: Diagnosis present

## 2021-07-27 NOTE — Therapy (Signed)
[facility] [address] [place], [place], [address] Phone: [phone]   Fax:  [phone]  Physical Therapy Evaluation  Patient Details  Name: John PartyCharles Jordan MRN: [id number] Date of Birth: 06/18/1961 Referring Provider (PT): Eulis FosterPatel, Siddhi K, [place]   Encounter Date: 07/27/2021   PT End of Session - 07/27/21 1112     Visit Number 1    Number of Visits 15    Date for PT Re-Evaluation 09/14/21    Authorization Type [facility]*    Authorization - Visit Number 1    Authorization - Number of Visits 15    Progress Note Due on Visit 10    PT Start Time 1116    PT Stop Time 1225    PT Time Calculation (min) 69 min    Equipment Utilized During Treatment Back brace;Other (comment)   L AFO   Activity Tolerance Patient limited by pain;Patient tolerated treatment well    Behavior During Therapy [facility] for tasks assessed/performed;Restless             Past Medical History:  Diagnosis Date   Allergy    seasonal allergies   Anxiety    Arthritis    generalized   Depression    Diverticulitis    GERD (gastroesophageal reflux disease)    on meds   Hypertension    on meds    Past Surgical History:  Procedure Laterality Date   [facility]   [facility]   HERNIA REPAIR Bilateral    bilateral inguinal hernia repair   WISDOM TOOTH EXTRACTION      There were no vitals filed for this visit.    Subjective Assessment - 07/27/21 1119     Subjective Patient reports that his original L5-S1 laminectomy and fusion surgery was on 06/08/21, but he "had to be reopened" on 06/10/21 in order to fix an issue with a nerve block overdose. Following the second surgery his RLE recovered to what he says is around "90%" but his LLE is lagging behind considerably. When questioned regarding bowel bladder dysfunction, he reported having to self apply a catheter prior to surgery. He does also report what sounds like  saddle anesthesia as well, but that the physicians are aware of these symptoms. He states that the physician cleared him to drive and increased his lifting restriction to 25lbs, but he does still have the no bending, lifting, or twisting precautions. He was experiencing spasms in his low back yesterday to the Jordan he almost went to the ER.    Patient is accompained by: Family member    Limitations Sitting;Standing;Walking;Lifting;House hold activities    How long can you sit comfortably? 5 minutes    How long can you stand comfortably? 2 minutes    How long can you walk comfortably? 2 minutes    Currently in Pain? 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    Referring Provider (PT) Eulis FosterPatel, Siddhi K, [place]    Onset Date/Surgical Date 06/08/21    Next MD  Visit 07/27/2021   This afternoon     Precautions   Precautions Fall;Back      Restrictions   Weight Bearing Restrictions Yes    Other Position/Activity Restrictions No BLT (no more than 25lbs)      Home Environment   Living Environment Private residence    Living Arrangements Spouse/significant other      Prior Function   Level of Independence Independent with basic ADLs    Vocation Retired      IT consultant   Overall Cognitive Status Within Functional Limits for tasks assessed      Observation/Other Assessments   Observations Well's Criteria Score: 2   Pitting edema and paresis of the lower extremities present     Observation/Other Assessments-Edema    Edema Circumferential      Sensation   Light Touch --      ROM / Strength   AROM / PROM / Strength Strength      Strength   Strength Assessment Site Hip;Knee;Ankle     Right/Left Hip Right;Left    Right Hip Flexion 4+/5    Right Hip ABduction 4/5    Right Hip ADduction 4+/5    Left Hip Flexion 3-/5    Left Hip ABduction 4/5    Left Hip ADduction 4+/5    Right/Left Knee Right;Left    Right Knee Flexion 4+/5    Right Knee Extension 5/5    Left Knee Flexion 4-/5    Left Knee Extension 4+/5    Right/Left Ankle Right;Left    Right Ankle Dorsiflexion 5/5    Right Ankle Plantar Flexion 5/5    Right Ankle Inversion 4/5    Right Ankle Eversion 4+/5    Left Ankle Dorsiflexion 3+/5    Left Ankle Plantar Flexion 4+/5    Left Ankle Inversion 4-/5    Left Ankle Eversion 3/5      Transfers   Transfers Sit to Stand;Stand to Sit    Sit to Stand 6: Modified independent (Device/Increase time)    Stand to Sit 6: Modified independent (Device/Increase time)      Ambulation/Gait   Ambulation/Gait Yes    Ambulation/Gait Assistance 6: Modified independent (Device/Increase time)    Ambulation Distance (Feet) 70 Feet    Assistive device Rollator    Gait Pattern Decreased dorsiflexion - left    Ambulation Surface Level                        Objective measurements completed on examination: See above findings.                PT Education - 07/27/21 1106     Education Details HEP, POC, further information regarding diagnosis, warning signs of DVT, and low back pain with respect to increased hip flexor tension.    Person(s) Educated Patient;Spouse    Methods Explanation;Handout;Demonstration;Verbal cues    Comprehension Verbalized understanding              PT Short Term Goals - 07/27/21 1308       PT SHORT TERM GOAL #1   Title Patient will achieve a Lt DF MMT of at least 4-.    Time 3    Period Weeks    Status New    Target Date 08/17/21      PT SHORT TERM GOAL #2   Title Patient will achieve a Lt hip flexion MMT of at least 4-.    Time 3    Period  Weeks    Status New    Target Date 08/17/21      PT SHORT TERM GOAL  #3   Title Patient will achieve an average resting low back pain level of no more than 4/10.    Time 3    Period Weeks    Status New    Target Date 08/17/21               PT Long Term Goals - 07/27/21 1311       PT LONG TERM GOAL #1   Title Patient will achieve a Lt ankle DF MMT of at least 4+.    Time 7    Period Weeks    Status New    Target Date 09/14/21      PT LONG TERM GOAL #2   Title Patient will achieve a Lt hip flexion MMT of at least 4+.    Time 7    Period Weeks    Status New    Target Date 09/14/21      PT LONG TERM GOAL #3   Title Patient will achieve a average resting low back pain level of no more than 2/10.    Time 7    Period Weeks    Status New    Target Date 09/14/21      PT LONG TERM GOAL #4   Title Patient will be able to ambulate at least 255ft without the use of a rollator or low back pain greater than a 3/10.    Time 7    Period Weeks    Status New    Target Date 09/14/21      PT LONG TERM GOAL #5   Title Patient will be independent with his HEP.    Time 7    Period Days    Status New    Target Date 09/14/21                    Plan - 07/27/21 1107     Clinical Impression Statement Patient is a  61 y.o. male presenting to physical therapy with c/o low back pain and BLE weakness following an abnormal nerve block application during lumbar spine surgery. He presents with pain limited deficits in strength, ROM, endurance, postural impairments, spinal mobility and functional mobility with ADL. He is having to modify and restrict ADL as indicated by subjective information and objective measures which is affecting overall participation. Patient will benefit from skilled physical therapy in order to improve function and reduce impairment.    Personal Factors and Comorbidities Age;Time since onset of injury/illness/exacerbation;Comorbidity 1    Examination-Activity Limitations Bend;Lift;Dressing;Squat;Carry;Locomotion Level;Caring for  Others;Bed Mobility;Bathing;Sit;Stand;Stairs;Sleep    Examination-Participation Restrictions Community Activity;Driving;Yard Work;Laundry;Cleaning    Stability/Clinical Decision Making Evolving/Moderate complexity    Clinical Decision Making Moderate    Rehab Potential Fair    PT Frequency 2x / week    PT Duration Other (comment)   7 weeks   PT Treatment/Interventions ADLs/Self Care Home Management;Electrical Stimulation;DME Instruction;Therapeutic activities;Functional mobility training;Stair training;Gait training;Therapeutic exercise;Balance training;Neuromuscular re-education;Patient/family education;Manual techniques;Passive range of motion;Joint Manipulations    PT Next Visit Plan Complete abuse screening and advanced directive. Assess hip flexor tension and utilize Thomas stretch if needed. Address L ankle DF limitations with stretching and/or manual therapy.    PT Home Exercise Plan Ankle Pumps in Elevation - 3-5 x daily - 7 x weekly - 2 sets - 15 reps  Seated Knee Extension AROM - 2-3 x daily -  7 x weekly - 3 sets - 10 reps  Seated Heel Slide - 2-3 x daily - 7 x weekly - 3 sets - 8 reps  Mini Squat with Chair - 2-3 x daily - 7 x weekly - 2 sets - 8 reps  Standing March with Counter Support - 2-3 x daily - 7 x weekly - 2 sets - 14 reps    Consulted and Agree with Plan of Care Patient;Family member/caregiver    Family Member Consulted Wife             Patient will benefit from skilled therapeutic intervention in order to improve the following deficits and impairments:  Abnormal gait, Decreased balance, Decreased endurance, Decreased mobility, Difficulty walking, Hypomobility, Increased muscle spasms, Decreased range of motion, Decreased activity tolerance, Decreased strength, Pain, Postural dysfunction, Impaired flexibility, Impaired tone, Impaired sensation  Visit Diagnosis: S/P lumbar fusion  Muscle weakness (generalized)  Difficulty in walking, not elsewhere classified  Chronic  bilateral low back pain, unspecified whether sciatica present     Problem List There are no problems to display for this patient.   1:36 PM,07/27/21 Creig Hinesonnor Nocole Zammit, DPT [facility] Physical [facility]   [facility] [address] , [place], [address] Phone: [phone]   Fax:  [phone]  Name: John Jordan MRN: [id number] Date of Birth: 08/27/1960

## 2021-07-27 NOTE — Patient Instructions (Signed)
Access Code: [email or web address] URL: [email or web address] Date: 07/27/2021 Prepared by: Creig Hines  Exercises Ankle Pumps in Elevation - 3-5 x daily - 7 x weekly - 2 sets - 15 reps Seated Knee Extension AROM - 2-3 x daily - 7 x weekly - 3 sets - 10 reps Seated Heel Slide - 2-3 x daily - 7 x weekly - 3 sets - 8 reps Mini Squat with Chair - 2-3 x daily - 7 x weekly - 2 sets - 8 reps Standing March with Counter Support - 2-3 x daily - 7 x weekly - 2 sets - 14 reps

## 2021-07-29 ENCOUNTER — Other Ambulatory Visit: Payer: Self-pay

## 2021-07-29 ENCOUNTER — Ambulatory Visit (HOSPITAL_COMMUNITY): Payer: No Typology Code available for payment source | Admitting: Physical Therapy

## 2021-07-29 ENCOUNTER — Encounter (HOSPITAL_COMMUNITY): Payer: Self-pay | Admitting: Physical Therapy

## 2021-07-29 DIAGNOSIS — Z981 Arthrodesis status: Secondary | ICD-10-CM | POA: Diagnosis not present

## 2021-07-29 DIAGNOSIS — M6281 Muscle weakness (generalized): Secondary | ICD-10-CM

## 2021-07-29 DIAGNOSIS — R262 Difficulty in walking, not elsewhere classified: Secondary | ICD-10-CM

## 2021-07-29 NOTE — Patient Instructions (Addendum)
Access Code: [email or web address] URL: [email or web address] Date: 07/29/2021 Prepared by: Creig Hines  Exercises Supine Diaphragmatic Breathing - 4-6 x daily - 7 x weekly - 1 sets - 12 reps Seated Diaphragmatic Breathing - 4-6 x daily - 7 x weekly - 1 sets - 12 reps   *Deep Abdominal Breathing Article URL posted below*  [email or web address]

## 2021-07-29 NOTE — Therapy (Signed)
[facility] [address] [place], [place], [address] Phone: [phone]   Fax:  [phone]  Physical Therapy Treatment  Patient Details  Name: Thalia PartyCharles Nitsch MRN: [id number] Date of Birth: 11/06/1960 Referring Provider (PT): Eulis FosterPatel, Siddhi K, [place]   Encounter Date: 07/29/2021   PT End of Session - 07/29/21 0818     Visit Number 2    Number of Visits 15    Date for PT Re-Evaluation 09/14/21    Authorization Type [facility]*    Authorization - Visit Number 2    Authorization - Number of Visits 15    Progress Note Due on Visit 10    PT Start Time [phone]    PT Stop Time 0816    PT Time Calculation (min) 43 min    Equipment Utilized During Treatment Back brace;Other (comment)   L AFO   Activity Tolerance Patient limited by pain;Patient tolerated treatment well    Behavior During Therapy [facility] for tasks assessed/performed;Restless             Past Medical History:  Diagnosis Date   Allergy    seasonal allergies   Anxiety    Arthritis    generalized   Depression    Diverticulitis    GERD (gastroesophageal reflux disease)    on meds   Hypertension    on meds    Past Surgical History:  Procedure Laterality Date   [facility]   HERNIA REPAIR Bilateral    bilateral inguinal hernia repair   WISDOM TOOTH EXTRACTION      There were no vitals filed for this visit.   Subjective Assessment - 07/29/21 0755     Subjective Patient reports that the exercises went well at home and there have not been any issues since the previous session. He states he has been monitoring his BP daily and has noticed a slow improvement over time.    Patient is accompained by: --    Limitations Sitting;Standing;Walking;Lifting;House hold activities    How long can you sit comfortably? 5 minutes    How long can you stand comfortably? 2 minutes    How long can you walk comfortably? 2 minutes     Currently in Pain? Yes    Pain Score 5     Pain Location Back    Pain Orientation Lower    Pain Type Surgical pain;Neuropathic pain    Pain Onset More than a month ago                               [facility] Treatment/Exercise - 07/29/21 0001       Exercises   Exercises Knee/Hip;Lumbar;Ankle      Knee/Hip Exercises: Aerobic   Nustep x186min(70-80spm)      Manual Therapy   Manual Therapy Passive ROM    Manual therapy comments Thomas Stretch 2x20s holds each      Ankle Exercises: Supine   Other Supine Ankle Exercises E-stim assisted L ankle DF x4712min   intensity level 44 // ramp 1s // on off 5/5 (placement Lt tib ant)                    PT Education - 07/29/21 0802     Education Details Deep breathing mechanics and it's effect on CSF flow.    Person(s) Educated Patient  Methods Handout;Explanation;Demonstration;Verbal cues    Comprehension Verbalized understanding;Returned demonstration              PT Short Term Goals - 07/27/21 1308       PT SHORT TERM GOAL #1   Title Patient will achieve a Lt DF MMT of at least 4-.    Time 3    Period Weeks    Status New    Target Date 08/17/21      PT SHORT TERM GOAL #2   Title Patient will achieve a Lt hip flexion MMT of at least 4-.    Time 3    Period Weeks    Status New    Target Date 08/17/21      PT SHORT TERM GOAL #3   Title Patient will achieve an average resting low back pain level of no more than 4/10.    Time 3    Period Weeks    Status New    Target Date 08/17/21               PT Long Term Goals - 07/27/21 1311       PT LONG TERM GOAL #1   Title Patient will achieve a Lt ankle DF MMT of at least 4+.    Time 7    Period Weeks    Status New    Target Date 09/14/21      PT LONG TERM GOAL #2   Title Patient will achieve a Lt hip flexion MMT of at least 4+.    Time 7    Period Weeks    Status New    Target Date 09/14/21      PT LONG TERM GOAL #3    Title Patient will achieve a average resting low back pain level of no more than 2/10.    Time 7    Period Weeks    Status New    Target Date 09/14/21      PT LONG TERM GOAL #4   Title Patient will be able to ambulate at least 218ft without the use of a rollator or low back pain greater than a 3/10.    Time 7    Period Weeks    Status New    Target Date 09/14/21      PT LONG TERM GOAL #5   Title Patient will be independent with his HEP.    Time 7    Period Days    Status New    Target Date 09/14/21                   Plan - 07/29/21 0816     Clinical Impression Statement Patient tolerated treatment well during today's session and did experience Lt tibialis anterior fatigue following completion of e-stim assisted DF. Thomas test for anterior hip tension was also performed bilaterally today and indicated increased tension bilaterally with the worse of the two hips being the Lt. Thomas stretch was not yet added to the patient's HEP as today was just a trial and his response will be recorded during the next session. Of note, the patient was not wearing his AFO during this session, but was wearing his back brace. Abdominal deep breathing exercises were given to the patient in addition to a research article detailing the benefits of CSF filtration rate with those techniques.    Personal Factors and Comorbidities Age;Time since onset of injury/illness/exacerbation;Comorbidity 1    Examination-Activity Limitations Bend;Lift;Dressing;Squat;Carry;Locomotion Level;Caring for Others;Bed Mobility;Bathing;Sit;Stand;Stairs;Sleep  Examination-Participation Restrictions Community Activity;Driving;Yard Work;Laundry;Cleaning    Stability/Clinical Decision Making Evolving/Moderate complexity    Rehab Potential Fair    PT Frequency 2x / week    PT Duration Other (comment)   7 weeks   PT Treatment/Interventions ADLs/Self Care Home Management;Electrical Stimulation;DME Instruction;Therapeutic  activities;Functional mobility training;Stair training;Gait training;Therapeutic exercise;Balance training;Neuromuscular re-education;Patient/family education;Manual techniques;Passive range of motion;Joint Manipulations    PT Next Visit Plan Address Lt ankle DF limitations with stretching and/or manual therapy and follow up with tibialis anterior strengthening. Continue with manual therapy addressing anterior hip tightness and provide handout assuming the patient is able to demonstrate independent setup.    PT Home Exercise Plan Ankle Pumps in Elevation - 3-5 x daily - 7 x weekly - 2 sets - 15 reps  Seated Knee Extension AROM - 2-3 x daily - 7 x weekly - 3 sets - 10 reps  Seated Heel Slide - 2-3 x daily - 7 x weekly - 3 sets - 8 reps  Mini Squat with Chair - 2-3 x daily - 7 x weekly - 2 sets - 8 reps  Standing March with Counter Support - 2-3 x daily - 7 x weekly - 2 sets - 14 reps Supine Diaphragmatic Breathing - 4-6 x daily - 7 x weekly - 1 sets - 12 reps  Seated Diaphragmatic Breathing - 4-6 x daily - 7 x weekly - 1 sets - 12 reps    Consulted and Agree with Plan of Care Patient    Family Member Consulted Wife             Patient will benefit from skilled therapeutic intervention in order to improve the following deficits and impairments:  Abnormal gait, Decreased balance, Decreased endurance, Decreased mobility, Difficulty walking, Hypomobility, Increased muscle spasms, Decreased range of motion, Decreased activity tolerance, Decreased strength, Pain, Postural dysfunction, Impaired flexibility, Impaired tone, Impaired sensation  Visit Diagnosis: S/P lumbar fusion  Muscle weakness (generalized)  Difficulty in walking, not elsewhere classified     Problem List There are no problems to display for this patient.   8:42 AM,07/29/21 Creig Hines, DPT [facility] Physical Therapy   [facility] [address] [place], [place],  [address] Phone: [phone]   Fax:  [phone]  Name: Arsal Tappan MRN: [id number] Date of Birth: [birth year]

## 2021-08-03 ENCOUNTER — Other Ambulatory Visit: Payer: Self-pay

## 2021-08-03 ENCOUNTER — Ambulatory Visit (HOSPITAL_COMMUNITY): Payer: No Typology Code available for payment source | Admitting: Physical Therapy

## 2021-08-03 DIAGNOSIS — Z981 Arthrodesis status: Secondary | ICD-10-CM | POA: Diagnosis not present

## 2021-08-03 DIAGNOSIS — R262 Difficulty in walking, not elsewhere classified: Secondary | ICD-10-CM

## 2021-08-03 DIAGNOSIS — G8929 Other chronic pain: Secondary | ICD-10-CM

## 2021-08-03 DIAGNOSIS — M6281 Muscle weakness (generalized): Secondary | ICD-10-CM

## 2021-08-03 NOTE — Therapy (Signed)
[facility] [address] [place], [place], [address] Phone: [phone]   Fax:  [phone]  Physical Therapy Treatment  Patient Details  Name: John Jordan MRN: [id number] Date of Birth: 03-22-1961 Referring Provider (PT): Eulis Foster, [place]   Encounter Date: 08/03/2021   PT End of Session - 08/03/21 1436     Visit Number 3    Number of Visits 15    Date for PT Re-Evaluation 09/14/21    Authorization Type [facility]*    Authorization - Visit Number 3    Authorization - Number of Visits 15    Progress Note Due on Visit 10    PT Start Time 1433    PT Stop Time 1519    PT Time Calculation (min) 46 min    Equipment Utilized During Treatment Back brace;Other (comment)   L AFO   Activity Tolerance Patient limited by pain;Patient tolerated treatment well    Behavior During Therapy [facility] for tasks assessed/performed;Restless             Past Medical History:  Diagnosis Date   Allergy    seasonal allergies   Anxiety    Arthritis    generalized   Depression    Diverticulitis    GERD (gastroesophageal reflux disease)    on meds   Hypertension    on meds    Past Surgical History:  Procedure Laterality Date   [facility]   HERNIA REPAIR Bilateral    bilateral inguinal hernia repair   WISDOM TOOTH EXTRACTION      There were no vitals filed for this visit.   Subjective Assessment - 08/03/21 1436     Subjective Patient reports that he will wake up in the morning in excruciating pain and high tone in his LLE. He is scheudled to see his surgeon tomorrow and discuss a neurological referral.    Limitations Sitting;Standing;Walking;Lifting;House hold activities    How long can you sit comfortably? 5 minutes    How long can you stand comfortably? 2 minutes    How long can you walk comfortably? 2 minutes    Currently in Pain? Yes    Pain Score 5    currently  medicated   Pain Location Back    Pain Orientation Lower    Pain Onset More than a month ago                               [facility] Adult PT Treatment/Exercise - 08/03/21 0001       Knee/Hip Exercises: Aerobic   Nustep x33min(70-80spm)      Manual Therapy   Manual therapy comments [name] Stretch 7x20s holds each      Ankle Exercises: Seated   Other Seated Ankle Exercises E-stim assisted L ankle DF x57min   intensity level 48 -->50 // ramp 1s // on off 5/5 (placement Lt tib ant)                    PT Education - 08/03/21 1512     Education Details Spasticity vs tone and upper vs lower motor neurons    Person(s) Educated Patient;Spouse    Methods Explanation    Comprehension Verbalized understanding              PT Short Term Goals - 07/27/21 1308  PT SHORT TERM GOAL #1   Title Patient will achieve a Lt DF MMT of at least 4-.    Time 3    Period Weeks    Status New    Target Date 08/17/21      PT SHORT TERM GOAL #2   Title Patient will achieve a Lt hip flexion MMT of at least 4-.    Time 3    Period Weeks    Status New    Target Date 08/17/21      PT SHORT TERM GOAL #3   Title Patient will achieve an average resting low back pain level of no more than 4/10.    Time 3    Period Weeks    Status New    Target Date 08/17/21               PT Long Term Goals - 07/27/21 1311       PT LONG TERM GOAL #1   Title Patient will achieve a Lt ankle DF MMT of at least 4+.    Time 7    Period Weeks    Status New    Target Date 09/14/21      PT LONG TERM GOAL #2   Title Patient will achieve a Lt hip flexion MMT of at least 4+.    Time 7    Period Weeks    Status New    Target Date 09/14/21      PT LONG TERM GOAL #3   Title Patient will achieve a average resting low back pain level of no more than 2/10.    Time 7    Period Weeks    Status New    Target Date 09/14/21      PT LONG TERM GOAL #4   Title Patient will be  able to ambulate at least 221ft without the use of a rollator or low back pain greater than a 3/10.    Time 7    Period Weeks    Status New    Target Date 09/14/21      PT LONG TERM GOAL #5   Title Patient will be independent with his HEP.    Time 7    Period Days    Status New    Target Date 09/14/21                   Plan - 08/03/21 1516     Clinical Impression Statement Patient tolerated treatment well today, although he did begin having irregular muscle contractions of the hip flexor on both sides. The frequency of these spasms was greater on the Lt and only occurred during the [facility] stretch. E-stim assisted Lt ankle DF was performed in a seated position during today's session which appeared to allow the patient better control over the movement.    Personal Factors and Comorbidities Age;Time since onset of injury/illness/exacerbation;Comorbidity 1    Examination-Activity Limitations Bend;Lift;Dressing;Squat;Carry;Locomotion Level;Caring for Others;Bed Mobility;Bathing;Sit;Stand;Stairs;Sleep    Examination-Participation Restrictions Community Activity;Driving;Yard Work;Laundry;Cleaning    Stability/Clinical Decision Making Evolving/Moderate complexity    Rehab Potential Fair    PT Frequency 2x / week    PT Duration Other (comment)   7 weeks   PT Treatment/Interventions ADLs/Self Care Home Management;Electrical Stimulation;DME Instruction;Therapeutic activities;Functional mobility training;Stair training;Gait training;Therapeutic exercise;Balance training;Neuromuscular re-education;Patient/family education;Manual techniques;Passive range of motion;Joint Manipulations    PT Next Visit Plan Address Lt ankle DF limitations with stretching and/or manual therapy and follow up with tibialis anterior strengthening.  Continue with manual therapy addressing anterior hip tightness and provide handout assuming the patient is able to demonstrate independent setup.    PT Home Exercise Plan  Ankle Pumps in Elevation - 3-5 x daily - 7 x weekly - 2 sets - 15 reps  Seated Knee Extension AROM - 2-3 x daily - 7 x weekly - 3 sets - 10 reps  Seated Heel Slide - 2-3 x daily - 7 x weekly - 3 sets - 8 reps  Mini Squat with Chair - 2-3 x daily - 7 x weekly - 2 sets - 8 reps  Standing March with Counter Support - 2-3 x daily - 7 x weekly - 2 sets - 14 reps Supine Diaphragmatic Breathing - 4-6 x daily - 7 x weekly - 1 sets - 12 reps  Seated Diaphragmatic Breathing - 4-6 x daily - 7 x weekly - 1 sets - 12 reps    Consulted and Agree with Plan of Care Patient    Family Member Consulted Wife             Patient will benefit from skilled therapeutic intervention in order to improve the following deficits and impairments:  Abnormal gait, Decreased balance, Decreased endurance, Decreased mobility, Difficulty walking, Hypomobility, Increased muscle spasms, Decreased range of motion, Decreased activity tolerance, Decreased strength, Pain, Postural dysfunction, Impaired flexibility, Impaired tone, Impaired sensation  Visit Diagnosis: S/P lumbar fusion  Muscle weakness (generalized)  Difficulty in walking, not elsewhere classified  Chronic bilateral low back pain, unspecified whether sciatica present     Problem List There are no problems to display for this patient.   Creig Hinesonnor  Aadhav Uhlig, PT 08/03/2021, 3:30 PM  [facility] [address] [place], [place], [address] Phone: [phone]   Fax:  [phone]  Name: John Jordan MRN: [id number] Date of Birth: 01/05/1961

## 2021-08-03 NOTE — Patient Instructions (Signed)
Gastroc Stretch on Wall - 3-4 x daily - 7 x weekly - 6 reps - 15s hold

## 2021-08-06 ENCOUNTER — Other Ambulatory Visit: Payer: Self-pay

## 2021-08-06 ENCOUNTER — Ambulatory Visit (HOSPITAL_COMMUNITY): Payer: No Typology Code available for payment source | Attending: Surgical | Admitting: Physical Therapy

## 2021-08-06 DIAGNOSIS — M545 Low back pain, unspecified: Secondary | ICD-10-CM | POA: Diagnosis present

## 2021-08-06 DIAGNOSIS — Z981 Arthrodesis status: Secondary | ICD-10-CM | POA: Insufficient documentation

## 2021-08-06 DIAGNOSIS — G8929 Other chronic pain: Secondary | ICD-10-CM | POA: Insufficient documentation

## 2021-08-06 DIAGNOSIS — R262 Difficulty in walking, not elsewhere classified: Secondary | ICD-10-CM | POA: Diagnosis present

## 2021-08-06 DIAGNOSIS — M6281 Muscle weakness (generalized): Secondary | ICD-10-CM | POA: Diagnosis present

## 2021-08-06 NOTE — Therapy (Signed)
[facility] [address] [place], [place], [address] Phone: [phone]   Fax:  [phone]  Physical Therapy Treatment  Patient Details  Name: John Jordan MRN: [id number] Date of Birth: Mar 17, 1961 Referring Provider (PT): Eulis Foster, [place]   Encounter Date: 08/06/2021   PT End of Session - 08/06/21 0946     Visit Number 4    Number of Visits 15    Date for PT Re-Evaluation 09/14/21    [facility] Type [facility]    Authorization - Visit Number 4    Authorization - Number of Visits 15    Progress Note Due on Visit 10    PT Start Time 0900    PT Stop Time 0941    PT Time Calculation (min) 41 min    Equipment Utilized During Treatment Back brace;Other (comment)   L AFO   Activity Tolerance Patient limited by pain;Patient tolerated treatment well    Behavior During Therapy [facility] for tasks assessed/performed;Restless             Past Medical History:  Diagnosis Date   Allergy    seasonal allergies   Anxiety    Arthritis    generalized   Depression    Diverticulitis    GERD (gastroesophageal reflux disease)    on meds   Hypertension    on meds    Past Surgical History:  Procedure Laterality Date   [facility]   [facility]   HERNIA REPAIR Bilateral    bilateral inguinal hernia repair   WISDOM TOOTH EXTRACTION      There were no vitals filed for this visit.   Subjective Assessment - 08/06/21 0902     Subjective Patient reports that he has started a new medication that is supposed to help wiht the muscle twitching he has been having in the LEs. He states that he has noticed a reduction in both frequency and intensity, but the twitching is still present to some extent.    Limitations Sitting;Standing;Walking;Lifting;House hold activities    How long can you sit comfortably? 5 minutes    How long can you stand comfortably? 2 minutes    How long can you walk  comfortably? 2 minutes    Currently in Pain? Yes    Pain Score 5     Pain Location Back    Pain Orientation Lower    Pain Onset More than a month ago                               [facility] Adult PT Treatment/Exercise - 08/06/21 0001       Knee/Hip Exercises: Aerobic   Nustep x48min(70-80spm)      Manual Therapy   Manual therapy comments Erby Pian x20s holds each x48min total      Ankle Exercises: Seated   Other Seated Ankle Exercises E-stim assisted L ankle DF x49min   intensity level 50 // ramp 1s // on off 5/5 (placement Lt tib ant)                      PT Short Term Goals - 07/27/21 1308       PT SHORT TERM GOAL #1   Title Patient will achieve a Lt DF MMT of at least 4-.    Time 3    Period Weeks    Status New  Target Date 08/17/21      PT SHORT TERM GOAL #2   Title Patient will achieve a Lt hip flexion MMT of at least 4-.    Time 3    Period Weeks    Status New    Target Date 08/17/21      PT SHORT TERM GOAL #3   Title Patient will achieve an average resting low back pain level of no more than 4/10.    Time 3    Period Weeks    Status New    Target Date 08/17/21               PT Long Term Goals - 07/27/21 1311       PT LONG TERM GOAL #1   Title Patient will achieve a Lt ankle DF MMT of at least 4+.    Time 7    Period Weeks    Status New    Target Date 09/14/21      PT LONG TERM GOAL #2   Title Patient will achieve a Lt hip flexion MMT of at least 4+.    Time 7    Period Weeks    Status New    Target Date 09/14/21      PT LONG TERM GOAL #3   Title Patient will achieve a average resting low back pain level of no more than 2/10.    Time 7    Period Weeks    Status New    Target Date 09/14/21      PT LONG TERM GOAL #4   Title Patient will be able to ambulate at least 23350ft without the use of a rollator or low back pain greater than a 3/10.    Time 7    Period Weeks    Status New    Target Date  09/14/21      PT LONG TERM GOAL #5   Title Patient will be independent with his HEP.    Time 7    Period Days    Status New    Target Date 09/14/21                   Plan - 08/06/21 0903     Clinical Impression Statement Patient tolerated treatment well during today's session and demonstrates improved toe clearance during swing phase without excessive hip flexion. Involuntary muscle contractions of the LLE has much improved since starting the new medication and additional force was able to be used for the [facility] stretch bilaterally.    Personal Factors and Comorbidities Age;Time since onset of injury/illness/exacerbation;Comorbidity 1    Examination-Activity Limitations Bend;Lift;Dressing;Squat;Carry;Locomotion Level;Caring for Others;Bed Mobility;Bathing;Sit;Stand;Stairs;Sleep    Examination-Participation Restrictions Community Activity;Driving;Yard Work;Laundry;Cleaning    Stability/Clinical Decision Making Evolving/Moderate complexity    Rehab Potential Fair    PT Frequency 2x / week    PT Duration Other (comment)   7 weeks   PT Treatment/Interventions ADLs/Self Care Home Management;Electrical Stimulation;DME Instruction;Therapeutic activities;Functional mobility training;Stair training;Gait training;Therapeutic exercise;Balance training;Neuromuscular re-education;Patient/family education;Manual techniques;Passive range of motion;Joint Manipulations    PT Next Visit Plan Address Lt ankle DF limitations with stretching and/or manual therapy and follow up with tibialis anterior strengthening. Continue with manual therapy addressing anterior hip tightness and provide handout assuming the patient is able to demonstrate independent setup.    PT Home Exercise Plan Ankle Pumps in Elevation - 3-5 x daily - 7 x weekly - 2 sets - 15 reps  Seated Knee Extension AROM - 2-3 x  daily - 7 x weekly - 3 sets - 10 reps  Seated Heel Slide - 2-3 x daily - 7 x weekly - 3 sets - 8 reps  Mini Squat with  Chair - 2-3 x daily - 7 x weekly - 2 sets - 8 reps  Standing March with Counter Support - 2-3 x daily - 7 x weekly - 2 sets - 14 reps Supine Diaphragmatic Breathing - 4-6 x daily - 7 x weekly - 1 sets - 12 reps  Seated Diaphragmatic Breathing - 4-6 x daily - 7 x weekly - 1 sets - 12 reps    Consulted and Agree with Plan of Care Patient    Family Member Consulted Wife             Patient will benefit from skilled therapeutic intervention in order to improve the following deficits and impairments:  Abnormal gait, Decreased balance, Decreased endurance, Decreased mobility, Difficulty walking, Hypomobility, Increased muscle spasms, Decreased range of motion, Decreased activity tolerance, Decreased strength, Pain, Postural dysfunction, Impaired flexibility, Impaired tone, Impaired sensation  Visit Diagnosis: S/P lumbar fusion  Muscle weakness (generalized)  Difficulty in walking, not elsewhere classified  Chronic bilateral low back pain, unspecified whether sciatica present     Problem List There are no problems to display for this patient.   Creig Hines, PT 08/06/2021, 9:49 AM  [facility] [address] [place], [place], [address] Phone: [phone]   Fax:  [phone]  Name: John Jordan MRN: [id number] Date of Birth: 02/01/1961

## 2021-08-11 ENCOUNTER — Other Ambulatory Visit: Payer: Self-pay

## 2021-08-11 ENCOUNTER — Ambulatory Visit (HOSPITAL_COMMUNITY): Payer: No Typology Code available for payment source | Admitting: Physical Therapy

## 2021-08-11 DIAGNOSIS — G8929 Other chronic pain: Secondary | ICD-10-CM

## 2021-08-11 DIAGNOSIS — Z981 Arthrodesis status: Secondary | ICD-10-CM | POA: Diagnosis not present

## 2021-08-11 DIAGNOSIS — M545 Low back pain, unspecified: Secondary | ICD-10-CM

## 2021-08-11 DIAGNOSIS — R262 Difficulty in walking, not elsewhere classified: Secondary | ICD-10-CM

## 2021-08-11 DIAGNOSIS — M6281 Muscle weakness (generalized): Secondary | ICD-10-CM

## 2021-08-11 NOTE — Therapy (Signed)
[facility] [address] [place], [place], [address] Phone: [phone]   Fax:  [phone]  Physical Therapy Treatment  Patient Details  Name: John Jordan MRN: [id number] Date of Birth: 05-25-1961 Referring Provider (PT): Eulis Foster, [place]   Encounter Date: 08/11/2021   PT End of Session - 08/11/21 1248     Visit Number 5    Number of Visits 15    Date for PT Re-Evaluation 09/14/21    Authorization Type [facility]    Authorization - Visit Number 5    Authorization - Number of Visits 15    Progress Note Due on Visit 10    PT Start Time 1116    PT Stop Time 1158    PT Time Calculation (min) 42 min    Equipment Utilized During Treatment Back brace;Other (comment)   L AFO   Activity Tolerance Patient limited by pain;Patient tolerated treatment well    Behavior During Therapy [facility] for tasks assessed/performed;Restless             Past Medical History:  Diagnosis Date   Allergy    seasonal allergies   Anxiety    Arthritis    generalized   Depression    Diverticulitis    GERD (gastroesophageal reflux disease)    on meds   Hypertension    on meds    Past Surgical History:  Procedure Laterality Date   [facility]  [facility]   [facility]   HERNIA REPAIR Bilateral    bilateral inguinal hernia repair   WISDOM TOOTH EXTRACTION      There were no vitals filed for this visit.   Subjective Assessment - 08/11/21 1120     Subjective Patient reports that he "tweaked" his back yesterday when he noticed the shopping cart was rolling away from his wife and he took two steps forward to attempt grabbing it. He states that he almost fell, but was able to stabilize himself. He has had a occasional sharp pain in his Rt low back since then.    Limitations Sitting;Standing;Walking;Lifting;House hold activities    How long can you sit comfortably? 5 minutes    How long can you stand  comfortably? 2 minutes    How long can you walk comfortably? 2 minutes    Currently in Pain? Yes    Pain Score 6     Pain Location Back    Pain Orientation Right;Lower    Pain Type Surgical pain;Neuropathic pain    Pain Onset More than a month ago                               [facility] Adult PT Treatment/Exercise - 08/11/21 0001       Knee/Hip Exercises: Aerobic   Nustep x66min(70-80spm)      Knee/Hip Exercises: Standing   Other Standing Knee Exercises Cable knee drives #2 3x14    Other Standing Knee Exercises Cable glute punches #3 2x10      Manual Therapy   Manual Therapy Soft tissue mobilization    Manual therapy comments Massage gun to Rt paraspinals and piriformis x31min                       PT Short Term Goals - 07/27/21 1308       PT SHORT TERM GOAL #1   Title Patient will achieve a Lt  DF MMT of at least 4-.    Time 3    Period Weeks    Status New    Target Date 08/17/21      PT SHORT TERM GOAL #2   Title Patient will achieve a Lt hip flexion MMT of at least 4-.    Time 3    Period Weeks    Status New    Target Date 08/17/21      PT SHORT TERM GOAL #3   Title Patient will achieve an average resting low back pain level of no more than 4/10.    Time 3    Period Weeks    Status New    Target Date 08/17/21               PT Long Term Goals - 07/27/21 1311       PT LONG TERM GOAL #1   Title Patient will achieve a Lt ankle DF MMT of at least 4+.    Time 7    Period Weeks    Status New    Target Date 09/14/21      PT LONG TERM GOAL #2   Title Patient will achieve a Lt hip flexion MMT of at least 4+.    Time 7    Period Weeks    Status New    Target Date 09/14/21      PT LONG TERM GOAL #3   Title Patient will achieve a average resting low back pain level of no more than 2/10.    Time 7    Period Weeks    Status New    Target Date 09/14/21      PT LONG TERM GOAL #4   Title Patient will be able to ambulate at  least 278ft without the use of a rollator or low back pain greater than a 3/10.    Time 7    Period Weeks    Status New    Target Date 09/14/21      PT LONG TERM GOAL #5   Title Patient will be independent with his HEP.    Time 7    Period Days    Status New    Target Date 09/14/21                   Plan - 08/11/21 1248     Clinical Impression Statement Patient tolerated treatment well and demonstrated an improvement in Lt knee flexion during gait at the end of today's session. Upon walking into the gym, there was ~10 degrees of Lt knee flexion during swing phase although the Lt toes were still able to clear the floor. Massage gun use to the Rt lumbar paraspinals and pirirformis did not have any significant effect on the patient's pain level.    Personal Factors and Comorbidities Age;Time since onset of injury/illness/exacerbation;Comorbidity 1    Examination-Activity Limitations Bend;Lift;Dressing;Squat;Carry;Locomotion Level;Caring for Others;Bed Mobility;Bathing;Sit;Stand;Stairs;Sleep    Examination-Participation Restrictions Community Activity;Driving;Yard Work;Laundry;Cleaning    Stability/Clinical Decision Making Evolving/Moderate complexity    Rehab Potential Fair    PT Frequency 2x / week    PT Duration Other (comment)   7 weeks   PT Treatment/Interventions ADLs/Self Care Home Management;Electrical Stimulation;DME Instruction;Therapeutic activities;Functional mobility training;Stair training;Gait training;Therapeutic exercise;Balance training;Neuromuscular re-education;Patient/family education;Manual techniques;Passive range of motion;Joint Manipulations    PT Next Visit Plan Address Lt ankle DF limitations with stretching and/or manual therapy and follow up with tibialis anterior strengthening. Continue with manual therapy addressing anterior hip  tightness and provide handout assuming the patient is able to demonstrate independent setup.    PT Home Exercise Plan Ankle  Pumps in Elevation - 3-5 x daily - 7 x weekly - 2 sets - 15 reps  Seated Knee Extension AROM - 2-3 x daily - 7 x weekly - 3 sets - 10 reps  Seated Heel Slide - 2-3 x daily - 7 x weekly - 3 sets - 8 reps  Mini Squat with Chair - 2-3 x daily - 7 x weekly - 2 sets - 8 reps  Standing March with Counter Support - 2-3 x daily - 7 x weekly - 2 sets - 14 reps Supine Diaphragmatic Breathing - 4-6 x daily - 7 x weekly - 1 sets - 12 reps  Seated Diaphragmatic Breathing - 4-6 x daily - 7 x weekly - 1 sets - 12 reps    Consulted and Agree with Plan of Care Patient    Family Member Consulted Wife             Patient will benefit from skilled therapeutic intervention in order to improve the following deficits and impairments:  Abnormal gait, Decreased balance, Decreased endurance, Decreased mobility, Difficulty walking, Hypomobility, Increased muscle spasms, Decreased range of motion, Decreased activity tolerance, Decreased strength, Pain, Postural dysfunction, Impaired flexibility, Impaired tone, Impaired sensation  Visit Diagnosis: S/P lumbar fusion  Muscle weakness (generalized)  Difficulty in walking, not elsewhere classified  Chronic bilateral low back pain, unspecified whether sciatica present     Problem List There are no problems to display for this patient.   Creig Hines, PT 08/11/2021, 12:57 PM  [place] [address] [place], [place], [address] Phone: [phone]   Fax:  [phone]  Name: John Jordan MRN: [id number] Date of Birth: 1961-05-14

## 2021-08-13 ENCOUNTER — Other Ambulatory Visit: Payer: Self-pay

## 2021-08-13 ENCOUNTER — Ambulatory Visit (HOSPITAL_COMMUNITY): Payer: No Typology Code available for payment source | Admitting: Physical Therapy

## 2021-08-13 DIAGNOSIS — R262 Difficulty in walking, not elsewhere classified: Secondary | ICD-10-CM

## 2021-08-13 DIAGNOSIS — Z981 Arthrodesis status: Secondary | ICD-10-CM | POA: Diagnosis not present

## 2021-08-13 DIAGNOSIS — M545 Low back pain, unspecified: Secondary | ICD-10-CM

## 2021-08-13 DIAGNOSIS — G8929 Other chronic pain: Secondary | ICD-10-CM

## 2021-08-13 DIAGNOSIS — M6281 Muscle weakness (generalized): Secondary | ICD-10-CM

## 2021-08-13 NOTE — Therapy (Signed)
[facility] [address] [place], [place], [address] Phone: [phone]   Fax:  [phone]  Physical Therapy Treatment  Patient Details  Name: John Jordan MRN: [id number] Date of Birth: 1960-10-27 Referring Provider (PT): Eulis Foster, [place]   Encounter Date: 08/13/2021   PT End of Session - 08/13/21 1020     Visit Number 6    Number of Visits 15    Date for PT Re-Evaluation 09/14/21    Authorization Type [facility]    Authorization - Visit Number 6    Authorization - Number of Visits 15    Progress Note Due on Visit 10    PT Start Time 1022    PT Stop Time 1113    PT Time Calculation (min) 51 min    Equipment Utilized During Treatment Back brace;Other (comment)   L AFO   Activity Tolerance Patient limited by pain;Patient tolerated treatment well    Behavior During Therapy [facility] for tasks assessed/performed;Restless             Past Medical History:  Diagnosis Date   Allergy    seasonal allergies   Anxiety    Arthritis    generalized   Depression    Diverticulitis    GERD (gastroesophageal reflux disease)    on meds   Hypertension    on meds    Past Surgical History:  Procedure Laterality Date   [facility]   HERNIA REPAIR Bilateral    bilateral inguinal hernia repair   WISDOM TOOTH EXTRACTION      There were no vitals filed for this visit.   Subjective Assessment - 08/13/21 1029     Subjective Patient reports that he is feeling stiff this morning, but he believes the knee drive exercise was beneficial for his ability to walk the rest of the day following the previous treatment session.    Limitations Sitting;Standing;Walking;Lifting;House hold activities    How long can you sit comfortably? 5 minutes    How long can you stand comfortably? 2 minutes    How long can you walk comfortably? 2 minutes    Currently in Pain? Yes    Pain Score 5      Pain Location Back    Pain Orientation Right;Lower    Pain Onset More than a month ago                               [facility] Adult PT Treatment/Exercise - 08/13/21 0001       Knee/Hip Exercises: Aerobic   Nustep x38min(70-80spm)      Knee/Hip Exercises: Standing   Other Standing Knee Exercises Cable knee drives #2 3x14    Other Standing Knee Exercises Cable glute punches #2 1x10 and #3 2x10      Ankle Exercises: Standing   Other Standing Ankle Exercises E-stim assisted ankle DF during slow march x76min and x11min seated (x63min total)   intensity level 52 // ramp 1s // on off 5/5 (placement Lt tib ant)     Ankle Exercises: Seated   Other Seated Ankle Exercises Banded(blue) ankle eversion 5x8 Lt side only                       PT Short Term Goals - 07/27/21 1308       PT SHORT TERM GOAL #1  Title Patient will achieve a Lt DF MMT of at least 4-.    Time 3    Period Weeks    Status New    Target Date 08/17/21      PT SHORT TERM GOAL #2   Title Patient will achieve a Lt hip flexion MMT of at least 4-.    Time 3    Period Weeks    Status New    Target Date 08/17/21      PT SHORT TERM GOAL #3   Title Patient will achieve an average resting low back pain level of no more than 4/10.    Time 3    Period Weeks    Status New    Target Date 08/17/21               PT Long Term Goals - 07/27/21 1311       PT LONG TERM GOAL #1   Title Patient will achieve a Lt ankle DF MMT of at least 4+.    Time 7    Period Weeks    Status New    Target Date 09/14/21      PT LONG TERM GOAL #2   Title Patient will achieve a Lt hip flexion MMT of at least 4+.    Time 7    Period Weeks    Status New    Target Date 09/14/21      PT LONG TERM GOAL #3   Title Patient will achieve a average resting low back pain level of no more than 2/10.    Time 7    Period Weeks    Status New    Target Date 09/14/21      PT LONG TERM GOAL #4   Title Patient  will be able to ambulate at least 229ft without the use of a rollator or low back pain greater than a 3/10.    Time 7    Period Weeks    Status New    Target Date 09/14/21      PT LONG TERM GOAL #5   Title Patient will be independent with his HEP.    Time 7    Period Days    Status New    Target Date 09/14/21                   Plan - 08/13/21 1021     Clinical Impression Statement Patient continues to tolerate treatment well, but increase LLE stiffness/tone was noted at the beginning of today's session. This resolved somewhat by the end of the session with regard to knee flexion during gait, but ankle DF on the Lt was diminished due to fatigue.    Personal Factors and Comorbidities Age;Time since onset of injury/illness/exacerbation;Comorbidity 1    Examination-Activity Limitations Bend;Lift;Dressing;Squat;Carry;Locomotion Level;Caring for Others;Bed Mobility;Bathing;Sit;Stand;Stairs;Sleep    Examination-Participation Restrictions Community Activity;Driving;Yard Work;Laundry;Cleaning    Stability/Clinical Decision Making Evolving/Moderate complexity    Rehab Potential Fair    PT Frequency 2x / week    PT Duration Other (comment)   7 weeks   PT Treatment/Interventions ADLs/Self Care Home Management;Electrical Stimulation;DME Instruction;Therapeutic activities;Functional mobility training;Stair training;Gait training;Therapeutic exercise;Balance training;Neuromuscular re-education;Patient/family education;Manual techniques;Passive range of motion;Joint Manipulations    PT Next Visit Plan Address Lt ankle DF limitations with stretching and/or manual therapy and follow up with tibialis anterior strengthening. Continue with manual therapy addressing anterior hip tightness and provide handout assuming the patient is able to demonstrate independent setup.    PT  Home Exercise Plan Ankle Pumps in Elevation - 3-5 x daily - 7 x weekly - 2 sets - 15 reps  Seated Knee Extension AROM - 2-3 x  daily - 7 x weekly - 3 sets - 10 reps  Seated Heel Slide - 2-3 x daily - 7 x weekly - 3 sets - 8 reps  Mini Squat with Chair - 2-3 x daily - 7 x weekly - 2 sets - 8 reps  Standing March with Counter Support - 2-3 x daily - 7 x weekly - 2 sets - 14 reps Supine Diaphragmatic Breathing - 4-6 x daily - 7 x weekly - 1 sets - 12 reps  Seated Diaphragmatic Breathing - 4-6 x daily - 7 x weekly - 1 sets - 12 reps    Consulted and Agree with Plan of Care Patient    Family Member Consulted Wife             Patient will benefit from skilled therapeutic intervention in order to improve the following deficits and impairments:  Abnormal gait, Decreased balance, Decreased endurance, Decreased mobility, Difficulty walking, Hypomobility, Increased muscle spasms, Decreased range of motion, Decreased activity tolerance, Decreased strength, Pain, Postural dysfunction, Impaired flexibility, Impaired tone, Impaired sensation  Visit Diagnosis: S/P lumbar fusion  Muscle weakness (generalized)  Difficulty in walking, not elsewhere classified  Chronic bilateral low back pain, unspecified whether sciatica present     Problem List There are no problems to display for this patient.   Creig Hines, PT 08/13/2021, 11:16 AM  [facility] [address] [place], [place], [address] Phone: [phone]   Fax:  [phone]  Name: Christhoper Busbee MRN: [id number] Date of Birth: [birth year]

## 2021-08-18 ENCOUNTER — Other Ambulatory Visit: Payer: Self-pay

## 2021-08-18 ENCOUNTER — Ambulatory Visit (HOSPITAL_COMMUNITY): Payer: No Typology Code available for payment source | Admitting: Physical Therapy

## 2021-08-18 DIAGNOSIS — R262 Difficulty in walking, not elsewhere classified: Secondary | ICD-10-CM

## 2021-08-18 DIAGNOSIS — Z981 Arthrodesis status: Secondary | ICD-10-CM

## 2021-08-18 DIAGNOSIS — M6281 Muscle weakness (generalized): Secondary | ICD-10-CM

## 2021-08-18 DIAGNOSIS — G8929 Other chronic pain: Secondary | ICD-10-CM

## 2021-08-18 NOTE — Patient Instructions (Signed)
Full Plank with Hip Flexion/Adduction Knee Drive on Counter - 1-2 x daily - 7 x weekly - 2 sets - 12 reps

## 2021-08-18 NOTE — Therapy (Signed)
[facility] [address] [place], [place], [address] Phone: [phone]   Fax:  [phone]  Physical Therapy Treatment  Patient Details  Name: John Jordan MRN: [id number] Date of Birth: [birth year] Referring Provider (PT): Eulis Foster, [place]   Encounter Date: 08/18/2021   PT End of Session - 08/18/21 1109     Visit Number 7    Number of Visits 15    Date for PT Re-Evaluation 09/14/21    [facility] Type [facility]    Authorization - Visit Number 7    Authorization - Number of Visits 15    Progress Note Due on Visit 10    PT Start Time 1110    PT Stop Time 1204    PT Time Calculation (min) 54 min    Equipment Utilized During Treatment Back brace;Other (comment)   L AFO   Activity Tolerance Patient limited by pain;Patient tolerated treatment well    Behavior During Therapy [facility] for tasks assessed/performed;Restless             Past Medical History:  Diagnosis Date   Allergy    seasonal allergies   Anxiety    Arthritis    generalized   Depression    Diverticulitis    GERD (gastroesophageal reflux disease)    on meds   Hypertension    on meds    Past Surgical History:  Procedure Laterality Date   [facility]   HERNIA REPAIR Bilateral    bilateral inguinal hernia repair   WISDOM TOOTH EXTRACTION      There were no vitals filed for this visit.   Subjective Assessment - 08/18/21 1108     Subjective Patient reports that he has been having increased stiffness in his Lt hamstring, but that it decreases in severity after each workout.    Limitations Sitting;Standing;Walking;Lifting;House hold activities    How long can you sit comfortably? 5 minutes    How long can you stand comfortably? 2 minutes    How long can you walk comfortably? 2 minutes    Currently in Pain? Yes    Pain Score 5     Pain Location Back    Pain Orientation Right;Lower     Pain Onset More than a month ago                               [facility] Adult PT Treatment/Exercise - 08/18/21 0001       Knee/Hip Exercises: Aerobic   Nustep x8.46min(70-80spm)      Knee/Hip Exercises: Standing   Other Standing Knee Exercises Cable knee drives #2 1x16 and #3 2x8    Other Standing Knee Exercises Cable glute punches #3 3x10      Knee/Hip Exercises: Seated   Other Seated Knee/Hip Exercises Seated hamstring curl(Body Craft) #2 3x10      Ankle Exercises: Standing   Other Standing Ankle Exercises E-stim assisted ankle DF during slow march x14min and x3min seated (x8min total)   intensity level 52 // ramp 1s // on off 5/5 (placement Lt tib ant)                      PT Short Term Goals - 07/27/21 1308       PT SHORT TERM GOAL #1   Title Patient will achieve a Lt DF MMT of at least  4-.    Time 3    Period Weeks    Status New    Target Date 08/17/21      PT SHORT TERM GOAL #2   Title Patient will achieve a Lt hip flexion MMT of at least 4-.    Time 3    Period Weeks    Status New    Target Date 08/17/21      PT SHORT TERM GOAL #3   Title Patient will achieve an average resting low back pain level of no more than 4/10.    Time 3    Period Weeks    Status New    Target Date 08/17/21               PT Long Term Goals - 07/27/21 1311       PT LONG TERM GOAL #1   Title Patient will achieve a Lt ankle DF MMT of at least 4+.    Time 7    Period Weeks    Status New    Target Date 09/14/21      PT LONG TERM GOAL #2   Title Patient will achieve a Lt hip flexion MMT of at least 4+.    Time 7    Period Weeks    Status New    Target Date 09/14/21      PT LONG TERM GOAL #3   Title Patient will achieve a average resting low back pain level of no more than 2/10.    Time 7    Period Weeks    Status New    Target Date 09/14/21      PT LONG TERM GOAL #4   Title Patient will be able to ambulate at least 253ft without the  use of a rollator or low back pain greater than a 3/10.    Time 7    Period Weeks    Status New    Target Date 09/14/21      PT LONG TERM GOAL #5   Title Patient will be independent with his HEP.    Time 7    Period Days    Status New    Target Date 09/14/21                   Plan - 08/18/21 1108     Clinical Impression Statement Patient tolerated treatment well today and continues to improve in foot clearance during gait and as well as strength level of hip flexion. Hamstring strength was also addressed today and the patient was greatly challenged.    Personal Factors and Comorbidities Age;Time since onset of injury/illness/exacerbation;Comorbidity 1    Examination-Activity Limitations Bend;Lift;Dressing;Squat;Carry;Locomotion Level;Caring for Others;Bed Mobility;Bathing;Sit;Stand;Stairs;Sleep    Examination-Participation Restrictions Community Activity;Driving;Yard Work;Laundry;Cleaning    Stability/Clinical Decision Making Evolving/Moderate complexity    Rehab Potential Fair    PT Frequency 2x / week    PT Duration Other (comment)   7 weeks   PT Treatment/Interventions ADLs/Self Care Home Management;Electrical Stimulation;DME Instruction;Therapeutic activities;Functional mobility training;Stair training;Gait training;Therapeutic exercise;Balance training;Neuromuscular re-education;Patient/family education;Manual techniques;Passive range of motion;Joint Manipulations    PT Next Visit Plan Address Lt ankle DF limitations with stretching and/or manual therapy and follow up with tibialis anterior strengthening. Continue with manual therapy addressing anterior hip tightness and provide handout assuming the patient is able to demonstrate independent setup.    PT Home Exercise Plan Ankle Pumps in Elevation - 3-5 x daily - 7 x weekly - 2 sets - 15 reps  Seated Knee Extension AROM - 2-3 x daily - 7 x weekly - 3 sets - 10 reps  Seated Heel Slide - 2-3 x daily - 7 x weekly - 3 sets - 8  reps  Mini Squat with Chair - 2-3 x daily - 7 x weekly - 2 sets - 8 reps  Standing March with Counter Support - 2-3 x daily - 7 x weekly - 2 sets - 14 reps Supine Diaphragmatic Breathing - 4-6 x daily - 7 x weekly - 1 sets - 12 reps  Seated Diaphragmatic Breathing - 4-6 x daily - 7 x weekly - 1 sets - 12 reps    Consulted and Agree with Plan of Care Patient    Family Member Consulted Wife             Patient will benefit from skilled therapeutic intervention in order to improve the following deficits and impairments:  Abnormal gait, Decreased balance, Decreased endurance, Decreased mobility, Difficulty walking, Hypomobility, Increased muscle spasms, Decreased range of motion, Decreased activity tolerance, Decreased strength, Pain, Postural dysfunction, Impaired flexibility, Impaired tone, Impaired sensation  Visit Diagnosis: S/P lumbar fusion  Muscle weakness (generalized)  Difficulty in walking, not elsewhere classified  Chronic bilateral low back pain, unspecified whether sciatica present     Problem List There are no problems to display for this patient.   Creig Hines, PT 08/18/2021, 12:19 PM  [facility] [address] [place], [place], [address] Phone: [phone]   Fax:  [phone]  Name: John Jordan MRN: [id number] Date of Birth: [birth year]

## 2021-08-20 ENCOUNTER — Other Ambulatory Visit: Payer: Self-pay

## 2021-08-20 ENCOUNTER — Ambulatory Visit (HOSPITAL_COMMUNITY): Payer: No Typology Code available for payment source | Admitting: Physical Therapy

## 2021-08-20 DIAGNOSIS — Z981 Arthrodesis status: Secondary | ICD-10-CM

## 2021-08-20 DIAGNOSIS — R262 Difficulty in walking, not elsewhere classified: Secondary | ICD-10-CM

## 2021-08-20 DIAGNOSIS — M6281 Muscle weakness (generalized): Secondary | ICD-10-CM

## 2021-08-20 DIAGNOSIS — M545 Low back pain, unspecified: Secondary | ICD-10-CM

## 2021-08-20 NOTE — Therapy (Signed)
[place], [place], [address] Phone: [phone]   Fax:  [phone]  Physical Therapy Treatment  Patient Details  Name: John Jordan MRN: [id number] Date of Birth: [birth year] Referring Provider (PT): Wenda Overland, [place]   Encounter Date: 08/20/2021   PT End of Session - 08/20/21 1023     Visit Number 8    Number of Visits 15    Date for PT Re-Evaluation 09/14/21    Authorization Type [place] - Visit Number 8    Authorization - Number of Visits 15    Progress Note Due on Visit 10    PT Start Time 1024    PT Stop Time 1049    PT Time Calculation (min) 25 min    Equipment Utilized During Treatment Back brace;Other (comment)   L AFO   Activity Tolerance Patient limited by pain;Patient tolerated treatment well    Behavior During Therapy [facility] for tasks assessed/performed;Restless             Past Medical History:  Diagnosis Date   Allergy    seasonal allergies   Anxiety    Arthritis    generalized   Depression    Diverticulitis    GERD (gastroesophageal reflux disease)    on meds   Hypertension    on meds    Past Surgical History:  Procedure Laterality Date   [facility]   HERNIA REPAIR Bilateral    bilateral inguinal hernia repair   WISDOM TOOTH EXTRACTION      There were no vitals filed for this visit.   Subjective Assessment - 08/20/21 1026     Subjective Patient states that he has been having a rough morning and would have actually preferred to skip today's appointment as a whole. He reports that mornings are the worst time for him as far as the stiffness and pain is concerned.    Limitations Sitting;Standing;Walking;Lifting;House hold activities    How long can you sit comfortably? 5 minutes    How long can you stand comfortably? 2 minutes    How long can you walk comfortably? 2 minutes    Currently  in Pain? Yes    Pain Score 7     Pain Location Back    Pain Orientation Right    Pain Onset More than a month ago                               [facility] Adult PT Treatment/Exercise - 08/20/21 0001       Knee/Hip Exercises: Aerobic   Nustep x65min(60-70spm)      Knee/Hip Exercises: Standing   Other Standing Knee Exercises Cable knee drives #3 3x8    Other Standing Knee Exercises Cable glute punches #3 3x10                       PT Short Term Goals - 07/27/21 1308       PT SHORT TERM GOAL #1   Title Patient will achieve a Lt DF MMT of at least 4-.    Time 3    Period Weeks    Status New    Target Date 08/17/21      PT SHORT TERM GOAL #2   Title Patient will achieve a Lt hip flexion MMT of at least  4-.    Time 3    Period Weeks    Status New    Target Date 08/17/21      PT SHORT TERM GOAL #3   Title Patient will achieve an average resting low back pain level of no more than 4/10.    Time 3    Period Weeks    Status New    Target Date 08/17/21               PT Long Term Goals - 07/27/21 1311       PT LONG TERM GOAL #1   Title Patient will achieve a Lt ankle DF MMT of at least 4+.    Time 7    Period Weeks    Status New    Target Date 09/14/21      PT LONG TERM GOAL #2   Title Patient will achieve a Lt hip flexion MMT of at least 4+.    Time 7    Period Weeks    Status New    Target Date 09/14/21      PT LONG TERM GOAL #3   Title Patient will achieve a average resting low back pain level of no more than 2/10.    Time 7    Period Weeks    Status New    Target Date 09/14/21      PT LONG TERM GOAL #4   Title Patient will be able to ambulate at least 266ft without the use of a rollator or low back pain greater than a 3/10.    Time 7    Period Weeks    Status New    Target Date 09/14/21      PT LONG TERM GOAL #5   Title Patient will be independent with his HEP.    Time 7    Period Days    Status New    Target  Date 09/14/21                   Plan - 08/20/21 1055     Clinical Impression Statement Despite the patient's starting point of pain, he did tolerate treatment well during this session. An abbreviated session was still used based on patient request and movement pattern exercises were prioritzed. Of note the patient arrived to today's session with a walking stick and not the usual rollator.    Personal Factors and Comorbidities Age;Time since onset of injury/illness/exacerbation;Comorbidity 1    Examination-Activity Limitations Bend;Lift;Dressing;Squat;Carry;Locomotion Level;Caring for Others;Bed Mobility;Bathing;Sit;Stand;Stairs;Sleep    Examination-Participation Restrictions Community Activity;Driving;Yard Work;Laundry;Cleaning    Stability/Clinical Decision Making Evolving/Moderate complexity    Rehab Potential Fair    PT Frequency 2x / week    PT Duration Other (comment)   7 weeks   PT Treatment/Interventions ADLs/Self Care Home Management;Electrical Stimulation;DME Instruction;Therapeutic activities;Functional mobility training;Stair training;Gait training;Therapeutic exercise;Balance training;Neuromuscular re-education;Patient/family education;Manual techniques;Passive range of motion;Joint Manipulations    PT Next Visit Plan Address Lt ankle DF limitations with stretching and/or manual therapy and follow up with tibialis anterior strengthening. Continue with manual therapy addressing anterior hip tightness and provide handout assuming the patient is able to demonstrate independent setup.    PT Home Exercise Plan Ankle Pumps in Elevation - 3-5 x daily - 7 x weekly - 2 sets - 15 reps  Seated Knee Extension AROM - 2-3 x daily - 7 x weekly - 3 sets - 10 reps  Seated Heel Slide - 2-3 x daily - 7 x weekly - 3  sets - 8 reps  Mini Squat with Chair - 2-3 x daily - 7 x weekly - 2 sets - 8 reps  Standing March with Counter Support - 2-3 x daily - 7 x weekly - 2 sets - 14 reps Supine Diaphragmatic  Breathing - 4-6 x daily - 7 x weekly - 1 sets - 12 reps  Seated Diaphragmatic Breathing - 4-6 x daily - 7 x weekly - 1 sets - 12 reps    Consulted and Agree with Plan of Care Patient    Family Member Consulted Wife             Patient will benefit from skilled therapeutic intervention in order to improve the following deficits and impairments:  Abnormal gait, Decreased balance, Decreased endurance, Decreased mobility, Difficulty walking, Hypomobility, Increased muscle spasms, Decreased range of motion, Decreased activity tolerance, Decreased strength, Pain, Postural dysfunction, Impaired flexibility, Impaired tone, Impaired sensation  Visit Diagnosis: S/P lumbar fusion  Muscle weakness (generalized)  Difficulty in walking, not elsewhere classified  Chronic bilateral low back pain, unspecified whether sciatica present     Problem List There are no problems to display for this patient.   Adalberto Cole, PT 08/20/2021, 10:57 AM  [place] [address] [place], [place], [address] Phone: [phone]   Fax:  [phone]  Name: Shafi Morgante MRN: [id number] Date of Birth: [birth year]

## 2021-08-25 ENCOUNTER — Ambulatory Visit (HOSPITAL_COMMUNITY): Payer: No Typology Code available for payment source | Admitting: Physical Therapy

## 2021-08-25 NOTE — Telephone Encounter (Signed)
Entered in error ° °1:19 PM, 08/25/21 °M. Kelly Janie Capp, PT, DPT °Physical Therapist- [place] °Office Number: [phone] °

## 2021-08-27 ENCOUNTER — Ambulatory Visit (HOSPITAL_COMMUNITY): Payer: No Typology Code available for payment source | Admitting: Physical Therapy

## 2021-09-01 ENCOUNTER — Ambulatory Visit (HOSPITAL_COMMUNITY): Payer: No Typology Code available for payment source | Admitting: Physical Therapy

## 2021-09-03 ENCOUNTER — Ambulatory Visit (HOSPITAL_COMMUNITY): Payer: No Typology Code available for payment source | Admitting: Physical Therapy

## 2021-09-04 ENCOUNTER — Emergency Department (HOSPITAL_COMMUNITY): Payer: No Typology Code available for payment source

## 2021-09-04 ENCOUNTER — Encounter (HOSPITAL_COMMUNITY): Payer: Self-pay | Admitting: *Deleted

## 2021-09-04 ENCOUNTER — Emergency Department (HOSPITAL_COMMUNITY)
Admission: EM | Admit: 2021-09-04 | Discharge: 2021-09-04 | Disposition: A | Discharge status: Home / Self Care | Payer: No Typology Code available for payment source | Attending: Emergency Medicine | Admitting: Emergency Medicine

## 2021-09-04 ENCOUNTER — Other Ambulatory Visit: Payer: Self-pay

## 2021-09-04 DIAGNOSIS — Z20822 Contact with and (suspected) exposure to covid-19: Secondary | ICD-10-CM | POA: Insufficient documentation

## 2021-09-04 DIAGNOSIS — D72829 Elevated white blood cell count, unspecified: Secondary | ICD-10-CM | POA: Insufficient documentation

## 2021-09-04 DIAGNOSIS — R509 Fever, unspecified: Secondary | ICD-10-CM

## 2021-09-04 DIAGNOSIS — R000 Tachycardia, unspecified: Secondary | ICD-10-CM | POA: Insufficient documentation

## 2021-09-04 DIAGNOSIS — R7402 Elevation of levels of lactic acid dehydrogenase (LDH): Secondary | ICD-10-CM | POA: Insufficient documentation

## 2021-09-04 DIAGNOSIS — B349 Viral infection, unspecified: Secondary | ICD-10-CM | POA: Insufficient documentation

## 2021-09-04 DIAGNOSIS — D649 Anemia, unspecified: Secondary | ICD-10-CM | POA: Insufficient documentation

## 2021-09-04 DIAGNOSIS — R7989 Other specified abnormal findings of blood chemistry: Secondary | ICD-10-CM | POA: Insufficient documentation

## 2021-09-04 DIAGNOSIS — Z79899 Other long term (current) drug therapy: Secondary | ICD-10-CM | POA: Insufficient documentation

## 2021-09-04 LAB — COMPREHENSIVE METABOLIC PANEL
ALT: 20 U/L (ref 0–44)
AST: 36 U/L (ref 15–41)
Albumin: 3.8 g/dL (ref 3.5–5.0)
Alkaline Phosphatase: 51 U/L (ref 38–126)
Anion gap: 9 (ref 5–15)
BUN: 21 mg/dL (ref 8–23)
CO2: 23 mmol/L (ref 22–32)
Calcium: 9.6 mg/dL (ref 8.9–10.3)
Chloride: 104 mmol/L (ref 98–111)
Creatinine, Ser: 1.63 mg/dL — ABNORMAL HIGH (ref 0.61–1.24)
GFR, Estimated: 48 mL/min — ABNORMAL LOW (ref >60)
Glucose, Bld: 132 mg/dL — ABNORMAL HIGH (ref 70–99)
Potassium: 4 mmol/L (ref 3.5–5.1)
Sodium: 136 mmol/L (ref 135–145)
Total Bilirubin: 0.4 mg/dL (ref 0.3–1.2)
Total Protein: 7 g/dL (ref 6.5–8.1)

## 2021-09-04 LAB — CBC WITH DIFFERENTIAL/PLATELET
Abs Immature Granulocytes: 0.05 10*3/uL (ref 0.00–0.07)
Basophils Absolute: 0.1 10*3/uL (ref 0.0–0.1)
Basophils Relative: 1 %
Eosinophils Absolute: 0.2 10*3/uL (ref 0.0–0.5)
Eosinophils Relative: 1 %
HCT: 36.4 % — ABNORMAL LOW (ref 39.0–52.0)
Hemoglobin: 12 g/dL — ABNORMAL LOW (ref 13.0–17.0)
Immature Granulocytes: 0 %
Lymphocytes Relative: 7 %
Lymphs Abs: 1 10*3/uL (ref 0.7–4.0)
MCH: 30.4 pg (ref 26.0–34.0)
MCHC: 33 g/dL (ref 30.0–36.0)
MCV: 92.2 fL (ref 80.0–100.0)
Monocytes Absolute: 0.7 10*3/uL (ref 0.1–1.0)
Monocytes Relative: 4 %
Neutro Abs: 12.9 10*3/uL — ABNORMAL HIGH (ref 1.7–7.7)
Neutrophils Relative %: 87 %
Platelets: 375 10*3/uL (ref 150–400)
RBC: 3.95 MIL/uL — ABNORMAL LOW (ref 4.22–5.81)
RDW: 12.9 % (ref 11.5–15.5)
WBC: 14.8 10*3/uL — ABNORMAL HIGH (ref 4.0–10.5)
nRBC: 0 % (ref 0.0–0.2)

## 2021-09-04 LAB — URINALYSIS, ROUTINE W REFLEX MICROSCOPIC
Bilirubin Urine: NEGATIVE
Glucose, UA: NEGATIVE mg/dL
Hgb urine dipstick: NEGATIVE
Ketones, ur: NEGATIVE mg/dL
Leukocytes,Ua: NEGATIVE
Nitrite: NEGATIVE
Protein, ur: NEGATIVE mg/dL
Specific Gravity, Urine: 1.02 (ref 1.005–1.030)
pH: 6 (ref 5.0–8.0)

## 2021-09-04 LAB — RESP PANEL BY RT-PCR (FLU A&B, COVID) ARPGX2
Influenza A by PCR: NEGATIVE
Influenza B by PCR: NEGATIVE
SARS Coronavirus 2 by RT PCR: NEGATIVE

## 2021-09-04 LAB — LACTIC ACID, PLASMA
Lactic Acid, Venous: 2 mmol/L (ref 0.5–1.9)
Lactic Acid, Venous: 1.4 mmol/L (ref 0.5–1.9)

## 2021-09-04 MED ORDER — SODIUM CHLORIDE 0.9% FLUSH
3.0000 mL | Freq: Once | INTRAVENOUS | Status: AC
  Administered 2021-09-04: 3 mL via INTRAVENOUS

## 2021-09-04 MED ORDER — SODIUM CHLORIDE 0.9 % IV BOLUS
1000.0000 mL | Freq: Once | INTRAVENOUS | Status: AC
  Administered 2021-09-04: 1000 mL via INTRAVENOUS

## 2021-09-04 NOTE — ED Notes (Signed)
EDP made aware about patients concerns of a new onset rash on his legs. ? ?

## 2021-09-04 NOTE — Discharge Instructions (Signed)
Your creatinine is mildly increased from before.  This will need to be followed by your doctor.  It was 1.6 today.  Otherwise your white count was mildly elevated.  Chest x-ray did not show pneumonia.  Urine did not show infection.  Your flu and COVID test was negative but you may benefit from another test in a day or 2.  Try and keep yourself hydrated. ?

## 2021-09-04 NOTE — ED Provider Notes (Signed)
?[facility] ?Provider Note ? ? ?CSN: [id number] ?Arrival date & time: 09/04/21  0753 ? ?  ? ?History ? ?Chief Complaint  ?Patient presents with  ? Fever  ? ? ?John Jordan is a 61 y.o. male. ? ? ?Fever ?Associated symptoms: no chest pain   ?Patient presents with fever.  Woke up with temperature 104.5 this morning.  Recently exposed to mother who had COVID.  Not having a cough however.  States his back does hurt more than normal.  He did his self catheterizations at home.  States that he had less urine this morning than usual and does have some pain in his abdomen.  States however the urine did not smell different or was not cloudy. ?  ? ?Home Medications ?Prior to Admission medications   ?Medication Sig Start Date End Date Taking? Authorizing Provider  ?baclofen (LIORESAL) 20 MG tablet Take 10-20 mg by mouth in the morning, at noon, and at bedtime. 20mg  morning and evening and 10mg  at noon 08/28/21  Yes [provider]  ?buPROPion (WELLBUTRIN SR) 150 MG 12 hr tablet Take 150 mg by mouth daily. 03/12/21  Yes [provider]  ?cetirizine (ZYRTEC) 10 MG tablet Take 10 mg by mouth daily as needed for allergies.   Yes [provider]  ?cholecalciferol (VITAMIN D3) 25 MCG (1000 UNIT) tablet Take 1,000 Units by mouth daily.   Yes [provider]  ?Cyanocobalamin (VITAMIN B-12 PO) Take 1 tablet by mouth daily.   Yes [provider]  ?hydrochlorothiazide (MICROZIDE) 12.5 MG capsule Take 12.5 mg by mouth daily. 08/28/21  Yes [provider]  ?hydrOXYzine (VISTARIL) 25 MG capsule Take 25 mg by mouth daily as needed for anxiety. 06/01/21  Yes [provider]  ?lisinopril (PRINIVIL,ZESTRIL) 10 MG tablet Take 10 mg by mouth daily.   Yes [provider]  ?ondansetron (ZOFRAN ODT) 4 MG disintegrating tablet 4mg  ODT q4 hours prn nausea/vomit ?Patient taking differently: Take 4 mg by mouth every 8 (eight) hours as needed for nausea or vomiting.  05/30/20  Yes Milton Ferguson, MD  ?oxyCODONE (ROXICODONE) 15 MG immediate release tablet Take 15 mg by mouth every 6 (six) hours. 08/04/21  Yes [provider]  ?pantoprazole (PROTONIX) 40 MG tablet Take 40 mg by mouth 2 (two) times daily. 12/31/19  Yes [provider]  ?pregabalin (LYRICA) 75 MG capsule Take 150 mg by mouth in the morning and at bedtime. 08/17/21  Yes [provider]  ?saccharomyces boulardii (FLORASTOR) 250 MG capsule Take 250 mg by mouth 2 (two) times daily.   Yes [provider]  ?traZODone (DESYREL) 150 MG tablet Take 75 mg by mouth at bedtime as needed for sleep. 06/01/21  Yes [provider]  ?ciprofloxacin (CIPRO) 500 MG tablet One po bid ?Patient not taking: Reported on 09/04/2021 05/30/20   Milton Ferguson, MD  ?HYDROcodone-acetaminophen (NORCO/VICODIN) 5-325 MG tablet Take 1 tablet by mouth every 6 (six) hours as needed. ?Patient not taking: Reported on 09/04/2021 05/30/20   Milton Ferguson, MD  ?metroNIDAZOLE (FLAGYL) 500 MG tablet One po bid ?Patient not taking: Reported on 09/04/2021 05/30/20   Milton Ferguson, MD  ?   ? ?Allergies    ?Nsaids and Sulfa antibiotics   ? ?Review of Systems   ?Review of Systems  ?Constitutional:  Positive for fever.  ?Respiratory:  Negative for shortness of breath.   ?Cardiovascular:  Negative for chest pain.  ?Genitourinary:  Negative for flank pain.  ?Musculoskeletal:  Positive for back  pain.  ?Neurological:  Negative for weakness.  ? ?Physical Exam ?Updated Vital Signs ?BP 112/74   Pulse 98   Temp 99.8 ?F (37.7 ?C) (Oral)   Resp 12   Ht 5\' 11"  (1.803 m)   Wt 99.8 kg   SpO2 95%   BMI 30.68 kg/m?  ?Physical Exam ?Vitals and nursing note reviewed.  ?Cardiovascular:  ?   Rate and Rhythm: Tachycardia present.  ?Pulmonary:  ?   Breath sounds: No wheezing or rhonchi.  ?Abdominal:  ?   Tenderness: There is no abdominal tenderness.  ?Musculoskeletal:     ?   General: No tenderness.  ?Skin: ?   Capillary Refill: Capillary  refill takes less than 2 seconds.  ?Neurological:  ?   Mental Status: He is alert and oriented to person, place, and time.  ? ? ?ED Results / Procedures / Treatments   ?Labs ?(all labs ordered are listed, but only abnormal results are displayed) ?Labs Reviewed  ?LACTIC ACID, PLASMA - Abnormal; Notable for the following components:  ?    Result Value  ? Lactic Acid, Venous 2.0 (*)   ? All other components within normal limits  ?COMPREHENSIVE METABOLIC PANEL - Abnormal; Notable for the following components:  ? Glucose, Bld 132 (*)   ? Creatinine, Ser 1.63 (*)   ? GFR, Estimated 48 (*)   ? All other components within normal limits  ?CBC WITH DIFFERENTIAL/PLATELET - Abnormal; Notable for the following components:  ? WBC 14.8 (*)   ? RBC 3.95 (*)   ? Hemoglobin 12.0 (*)   ? HCT 36.4 (*)   ? Neutro Abs 12.9 (*)   ? All other components within normal limits  ?RESP PANEL BY RT-PCR (FLU A&B, COVID) [id number]  ?CULTURE, BLOOD (ROUTINE X 2)  ?CULTURE, BLOOD (ROUTINE X 2)  ?LACTIC ACID, PLASMA  ?URINALYSIS, ROUTINE W REFLEX MICROSCOPIC  ? ? ?EKG ?None ? ?Radiology ?DG Chest Port 1 View ? ?Result Date: 09/04/2021 ?CLINICAL DATA:  Fever. EXAM: PORTABLE CHEST 1 VIEW COMPARISON:  None. FINDINGS: Heart size is normal. Atherosclerotic changes are noted at the aortic arch. No edema or effusion is present. No focal airspace disease present. Lung volumes are somewhat low. Soft tissues and bony thorax are unremarkable. IMPRESSION: 1. Low lung volumes. 2. No acute cardiopulmonary disease. 3. Aortic atherosclerosis. Electronically Signed   By: San Morelle M.D.   On: 09/04/2021 09:10   ? ?Procedures ?Procedures  ? ? ?Medications Ordered in ED ?Medications  ?sodium chloride flush (NS) 0.9 % injection 3 mL (3 mLs Intravenous Given 09/04/21 0842)  ?sodium chloride 0.9 % bolus 1,000 mL (0 mLs Intravenous Stopped 09/04/21 1027)  ? ? ?ED Course/ Medical Decision Making/ A&P ?  ?                        ?Medical Decision Making ?Problems  Addressed: ?Fever: acute illness or injury ?Viral syndrome: acute illness or injury ? ?Amount and/or Complexity of Data Reviewed ?Labs: ordered. ?Radiology: ordered. ? ? ?Patient came in for fever.  Feeling bad.  Recent exposure to COVID.  Temperature of 104.5 at home.  Initially tachycardic.  Has improved.  Does self cath at home.  Mildly decreased urine output.  Creatinine slightly increased.  White count mildly elevated.  Mild anemia.  Initial lactic acid slightly elevated 2 but improved on recheck.  Heart rate has come down.  Blood pressure is maintained.  Chest x-ray done and independently interpreted and reassuring with  no pneumonia.  Urine also done and shows no infection.  Does have little bit of rash developed on legs.  Mild petechial rash.  Nonblanching.  Normal platelets.  Potentially could be a viral exanthem.  No bacterial infection found.  Potentially could be viral.  Negative flu and COVID testing although potential with the PCR could be a little early since symptoms to started today.  Appears stable for discharge home.  Creatinine increased up to 1.6.  Will require outpatient follow-up.  Encouraged to increase some fluids.  Discharge home. ? ? ? ? ? ? ? ?Final Clinical Impression(s) / ED Diagnoses ?Final diagnoses:  ?Viral syndrome  ? ? ?Rx / DC Orders ?ED Discharge Orders   ? ? None  ? ?  ? ? ?  ?Davonna Belling, MD ?09/04/21 1108 ? ?

## 2021-09-04 NOTE — ED Triage Notes (Signed)
Pt c/o fever of 104.5 that started this morning when he woke. He reports his mother recently died of Covid and he was exposed to her. Pt also self-caths and reports this morning he had decreased amount of urine output. Denies foul odor in his urine or different color. ?

## 2021-09-08 ENCOUNTER — Ambulatory Visit (HOSPITAL_COMMUNITY): Payer: No Typology Code available for payment source | Attending: Surgical | Admitting: Physical Therapy

## 2021-09-08 ENCOUNTER — Other Ambulatory Visit: Payer: Self-pay

## 2021-09-08 DIAGNOSIS — M6281 Muscle weakness (generalized): Secondary | ICD-10-CM | POA: Insufficient documentation

## 2021-09-08 DIAGNOSIS — G8929 Other chronic pain: Secondary | ICD-10-CM | POA: Insufficient documentation

## 2021-09-08 DIAGNOSIS — Z981 Arthrodesis status: Secondary | ICD-10-CM | POA: Diagnosis present

## 2021-09-08 DIAGNOSIS — R262 Difficulty in walking, not elsewhere classified: Secondary | ICD-10-CM | POA: Diagnosis present

## 2021-09-08 DIAGNOSIS — M545 Low back pain, unspecified: Secondary | ICD-10-CM | POA: Insufficient documentation

## 2021-09-08 NOTE — Therapy (Signed)
[place] ?[facility] ?[address] ?[place], [place], [address] ?Phone: [phone]   Fax:  [phone] ? ?Physical Therapy Treatment ? ?Patient Details  ?Name: John Jordan ?MRN: [id number] ?Date of Birth: Sep 24, 1960 ?Referring Provider (PT): Eulis Foster, [place] ? ? ?Encounter Date: 09/08/2021 ? ? PT End of Session - 09/08/21 1144   ? ? Visit Number 9   ? Number of Visits 15   ? Date for PT Re-Evaluation 09/14/21   ? [facility] Type [facility]   ? Authorization - Visit Number 9   ? Authorization - Number of Visits 15   ? Progress Note Due on Visit 10   ? PT Start Time 1116   ? PT Stop Time 1159   ? PT Time Calculation (min) 43 min   ? Equipment Utilized During Treatment Back brace;Other (comment)   L AFO  ? Activity Tolerance Patient limited by pain;Patient tolerated treatment well   ? Behavior During Therapy [facility] for tasks assessed/performed;Restless   ? ?  ?  ? ?  ? ? ?Past Medical History:  ?Diagnosis Date  ? Allergy   ? seasonal allergies  ? Anxiety   ? Arthritis   ? generalized  ? Depression   ? Diverticulitis   ? GERD (gastroesophageal reflux disease)   ? on meds  ? Hypertension   ? on meds  ? ? ?Past Surgical History:  ?Procedure Laterality Date  ? BACK SURGERY    ? [facility]  [facility]  [facility]  ? HERNIA REPAIR Bilateral   ? bilateral inguinal hernia repair  ? WISDOM TOOTH EXTRACTION    ? ? ?There were no vitals filed for this visit. ? ? Subjective Assessment - 09/08/21 1119   ? ? Subjective Patient reports that he has had a difficult past week as he lost his mother and was unable to attend her funeral as his own illness(a fever of 104.5) prevented him from doing so. He states that he has been trying to keep up with his exercises at home as best he can.   ? Limitations Sitting;Standing;Walking;Lifting;House hold activities   ? How long can you sit comfortably? 5 minutes   ? How long can you stand comfortably? 2 minutes   ?  How long can you walk comfortably? 2 minutes   ? Currently in Pain? Yes   ? Pain Score 4    ? Pain Location Back   ? Pain Onset More than a month ago   ? ?  ?  ? ?  ? ? ? ? ? ? ? ? ? ? ? ? ? ? ? ? ? ? ? ? [facility] Adult PT Treatment/Exercise - 09/08/21 0001   ? ?  ? Knee/Hip Exercises: Aerobic  ? Nustep x8.72min(60-70spm)   ?  ? Knee/Hip Exercises: Standing  ? Other Standing Knee Exercises Cable knee drives #3 4x10   ? Other Standing Knee Exercises Cable glute punches #3 2x12 and #4 1x8 and 1x10   ?  ? Manual Therapy  ? Manual Therapy Passive ROM   ? Manual therapy comments Erby Pian x20s holds each x37min total   ? ?  ?  ? ?  ? ? ? ? ? ? ? ? ? ? ? ? PT Short Term Goals - 07/27/21 1308   ? ?  ? PT SHORT TERM GOAL #1  ? Title Patient will achieve a Lt DF MMT of at least 4-.   ?  Time 3   ? Period Weeks   ? Status New   ? Target Date 08/17/21   ?  ? PT SHORT TERM GOAL #2  ? Title Patient will achieve a Lt hip flexion MMT of at least 4-.   ? Time 3   ? Period Weeks   ? Status New   ? Target Date 08/17/21   ?  ? PT SHORT TERM GOAL #3  ? Title Patient will achieve an average resting low back pain level of no more than 4/10.   ? Time 3   ? Period Weeks   ? Status New   ? Target Date 08/17/21   ? ?  ?  ? ?  ? ? ? ? PT Long Term Goals - 07/27/21 1311   ? ?  ? PT LONG TERM GOAL #1  ? Title Patient will achieve a Lt ankle DF MMT of at least 4+.   ? Time 7   ? Period Weeks   ? Status New   ? Target Date 09/14/21   ?  ? PT LONG TERM GOAL #2  ? Title Patient will achieve a Lt hip flexion MMT of at least 4+.   ? Time 7   ? Period Weeks   ? Status New   ? Target Date 09/14/21   ?  ? PT LONG TERM GOAL #3  ? Title Patient will achieve a average resting low back pain level of no more than 2/10.   ? Time 7   ? Period Weeks   ? Status New   ? Target Date 09/14/21   ?  ? PT LONG TERM GOAL #4  ? Title Patient will be able to ambulate at least 258ft without the use of a rollator or low back pain greater than a 3/10.   ? Time 7   ? Period  Weeks   ? Status New   ? Target Date 09/14/21   ?  ? PT LONG TERM GOAL #5  ? Title Patient will be independent with his HEP.   ? Time 7   ? Period Days   ? Status New   ? Target Date 09/14/21   ? ?  ?  ? ?  ? ? ? ? ? ? ? ? Plan - 09/08/21 1207   ? ? Clinical Impression Statement Patient tolerated treatment well during today's session although there was an increase amount of tremors noted bilaterally during the [facility] stretch. Strength has been well maintained despite the patient's recent illness and resistance level was actually able to be increased for the lgute punch exercise. Ambulation has regressed during this session and shows decrease knee flexion during swing phase bilaterally.   ? Personal Factors and Comorbidities Age;Time since onset of injury/illness/exacerbation;Comorbidity 1   ? Examination-Activity Limitations Bend;Lift;Dressing;Squat;Carry;Locomotion Level;Caring for Others;Bed Mobility;Bathing;Sit;Stand;Stairs;Sleep   ? Examination-Participation Restrictions Community Activity;Driving;Yard Work;Laundry;Cleaning   ? Stability/Clinical Decision Making Evolving/Moderate complexity   ? Rehab Potential Fair   ? PT Frequency 2x / week   ? PT Duration Other (comment)   7 weeks  ? PT Treatment/Interventions ADLs/Self Care Home Management;Electrical Stimulation;DME Instruction;Therapeutic activities;Functional mobility training;Stair training;Gait training;Therapeutic exercise;Balance training;Neuromuscular re-education;Patient/family education;Manual techniques;Passive range of motion;Joint Manipulations   ? PT Next Visit Plan Perform progress note during next session. Address Lt ankle DF limitations with stretching and/or manual therapy and follow up with tibialis anterior strengthening. Continue with manual therapy addressing anterior hip tightness and provide handout assuming the patient is able to demonstrate  independent setup.   ? PT Home Exercise Plan Ankle Pumps in Elevation - 3-5 x daily - 7 x  weekly - 2 sets - 15 reps  Seated Knee Extension AROM - 2-3 x daily - 7 x weekly - 3 sets - 10 reps  Seated Heel Slide - 2-3 x daily - 7 x weekly - 3 sets - 8 reps  Mini Squat with Chair - 2-3 x daily - 7 x weekly - 2 sets - 8 reps  Standing March with Counter Support - 2-3 x daily - 7 x weekly - 2 sets - 14 reps Supine Diaphragmatic Breathing - 4-6 x daily - 7 x weekly - 1 sets - 12 reps  Seated Diaphragmatic Breathing - 4-6 x daily - 7 x weekly - 1 sets - 12 reps   ? Consulted and Agree with Plan of Care Patient   ? Family Member Consulted Wife   ? ?  ?  ? ?  ? ? ?Patient will benefit from skilled therapeutic intervention in order to improve the following deficits and impairments:  Abnormal gait, Decreased balance, Decreased endurance, Decreased mobility, Difficulty walking, Hypomobility, Increased muscle spasms, Decreased range of motion, Decreased activity tolerance, Decreased strength, Pain, Postural dysfunction, Impaired flexibility, Impaired tone, Impaired sensation ? ?Visit Diagnosis: ?S/P lumbar fusion ? ?Muscle weakness (generalized) ? ?Difficulty in walking, not elsewhere classified ? ?Chronic bilateral low back pain, unspecified whether sciatica present ? ? ? ? ?Problem List ?There are no problems to display for this patient. ? ? ?Creig Hines, PT ?09/08/2021, 12:12 PM ? ?[place] ?[facility] ?[address] ?[place], [place], [address] ?Phone: [phone]   Fax:  [phone] ? ?Name: John Jordan ?MRN: [id number] ?Date of Birth: [birth year] ? ? ? ?

## 2021-09-09 LAB — CULTURE, BLOOD (ROUTINE X 2)
Culture: NO GROWTH
Culture: NO GROWTH
Special Requests: ADEQUATE

## 2021-09-10 ENCOUNTER — Ambulatory Visit (HOSPITAL_COMMUNITY): Payer: No Typology Code available for payment source | Admitting: Physical Therapy

## 2021-09-15 ENCOUNTER — Ambulatory Visit (HOSPITAL_COMMUNITY): Payer: No Typology Code available for payment source | Admitting: Physical Therapy

## 2021-09-15 ENCOUNTER — Encounter (HOSPITAL_COMMUNITY): Payer: Self-pay | Admitting: Physical Therapy

## 2021-09-15 ENCOUNTER — Other Ambulatory Visit: Payer: Self-pay

## 2021-09-15 DIAGNOSIS — M545 Low back pain, unspecified: Secondary | ICD-10-CM

## 2021-09-15 DIAGNOSIS — R262 Difficulty in walking, not elsewhere classified: Secondary | ICD-10-CM

## 2021-09-15 DIAGNOSIS — Z981 Arthrodesis status: Secondary | ICD-10-CM | POA: Diagnosis not present

## 2021-09-15 DIAGNOSIS — M6281 Muscle weakness (generalized): Secondary | ICD-10-CM

## 2021-09-15 NOTE — Therapy (Signed)
[place] ?[facility] ?[address] ?[place], [place], [address] ?Phone: [phone]   Fax:  [phone] ? ?Physical Therapy Progress Note ? ?Patient Details  ?Name: John Jordan ?MRN: [id number] ?Date of Birth: 06/26/1961 ?Referring Provider (PT): John Jordan, John Jordan, [place] ? ? ?Encounter Date: 09/15/2021 ? ? PT End of Session - 09/15/21 1155   ? ? Visit Number 10   ? Number of Visits 15   ? Date for PT Re-Evaluation 09/14/21   ? [facility]   ? Authorization - Visit Number 10   ? Authorization - Number of Visits 15   ? Progress Note Due on Visit 10   ? PT Start Time 1117   ? PT Stop Time 1156   ? PT Time Calculation (min) 39 min   ? Equipment Utilized During Treatment Back brace;Other (comment)   L AFO  ? Activity Tolerance Patient limited by pain;Patient tolerated treatment well   ? [facility] tasks assessed/performed;Restless   ? ?  ?  ? ?  ? ? ?Past Medical History:  ?Diagnosis Date  ? Allergy   ? seasonal allergies  ? Anxiety   ? Arthritis   ? generalized  ? Depression   ? Diverticulitis   ? GERD (gastroesophageal reflux disease)   ? on meds  ? Hypertension   ? on meds  ? ? ?Past Surgical History:  ?Procedure Laterality Date  ? BACK SURGERY    ? [facility]  [facility]  [facility]  ? HERNIA REPAIR Bilateral   ? bilateral inguinal hernia repair  ? WISDOM TOOTH EXTRACTION    ? ? ?There were no vitals filed for this visit. ? ? ? ? ? ? [facility] PT Assessment - 09/15/21 0001   ? ?  ? Strength  ? Right Hip Flexion 4+/5   ? Right Hip ABduction 5/5   ? Right Hip ADduction 5/5   ? Left Hip Flexion 3+/5   ? Left Hip ABduction 5/5   ? Left Hip ADduction 4+/5   ? Right Knee Flexion 5/5   ? Right Knee Extension 5/5   ? Left Knee Flexion 4/5   ? Left Knee Extension 4+/5   ? Right Ankle Dorsiflexion 5/5   ? Right Ankle Plantar Flexion 5/5   ? Right Ankle Inversion 4/5   ? Right Ankle Eversion 4/5   ? Left Ankle Dorsiflexion  4-/5   ? Left Ankle Plantar Flexion 4+/5   ? Left Ankle Inversion 4-/5   ? Left Ankle Eversion 3/5   ?  ? Transfers  ? Transfers Sit to Stand;Stand to Sit   ? Sit to Stand 6: Modified independent (Device/Increase time)   ? Stand to Sit 6: Modified independent (Device/Increase time)   ?  ? Ambulation/Gait  ? Gait velocity 0.59 m/s   ? ?  ?  ? ?  ? ? ? ? ? ? ? ? ? ? ? ? ? ? ? ? [facility] Adult PT Treatment/Exercise - 09/15/21 0001   ? ?  ? Knee/Hip Exercises: Aerobic  ? Nustep x538min(60-70spm)   ?  ? Knee/Hip Exercises: Standing  ? Other Standing Knee Exercises Cable knee drives #3 2x10   ? Other Standing Knee Exercises Cable glute punches #4 4x10   ?  ? Manual Therapy  ? Manual Therapy Passive ROM   ? Manual therapy comments Erby Pianhomas Stretch x20s holds each x3712min total   ? ?  ?  ? ?  ? ? ? ? ? ? ? ? ? ? ? ?  PT Short Term Goals - 07/27/21 1308   ? ?  ? PT SHORT TERM GOAL #1  ? Title Patient will achieve a Lt DF MMT of at least 4-.   ? Time 3   ? Period Weeks   ? Status New   ? Target Date 08/17/21   ?  ? PT SHORT TERM GOAL #2  ? Title Patient will achieve a Lt hip flexion MMT of at least 4-.   ? Time 3   ? Period Weeks   ? Status New   ? Target Date 08/17/21   ?  ? PT SHORT TERM GOAL #3  ? Title Patient will achieve an average resting low back pain level of no more than 4/10.   ? Time 3   ? Period Weeks   ? Status New   ? Target Date 08/17/21   ? ?  ?  ? ?  ? ? ? ? PT Long Term Goals - 07/27/21 1311   ? ?  ? PT LONG TERM GOAL #1  ? Title Patient will achieve a Lt ankle DF MMT of at least 4+.   ? Time 7   ? Period Weeks   ? Status New   ? Target Date 09/14/21   ?  ? PT LONG TERM GOAL #2  ? Title Patient will achieve a Lt hip flexion MMT of at least 4+.   ? Time 7   ? Period Weeks   ? Status New   ? Target Date 09/14/21   ?  ? PT LONG TERM GOAL #3  ? Title Patient will achieve a average resting low back pain level of no more than 2/10.   ? Time 7   ? Period Weeks   ? Status New   ? Target Date 09/14/21   ?  ? PT LONG TERM GOAL  #4  ? Title Patient will be able to ambulate at least 282ft without the use of a rollator or low back pain greater than a 3/10.   ? Time 7   ? Period Weeks   ? Status New   ? Target Date 09/14/21   ?  ? PT LONG TERM GOAL #5  ? Title Patient will be independent with his HEP.   ? Time 7   ? Period Days   ? Status New   ? Target Date 09/14/21   ? ?  ?  ? ?  ? ? ? ? ? ? ? ? Plan - 09/15/21 1235   ? ? Clinical Impression Statement Patient tolerated treatment well and retesting of manual muscle testing indicated an improvement in hip and knee strength. Ankle strength however has been either slow to improve if at all. Ambulation is still extremely stiff and involves little to no knee flexion during swing phase of either LE. Thomas testing was negative on both sides, but he is still having infrequently tremors and muscle cramping while stretching the LLE. The rate of occurance of these tremors and cramps was at a much higher frequency 2-3 weeks ago and has reduced, but it is still present. Sensation is extremely slow to return and the patient does have alternating reports of cold or hot sensations in the LLE. Rollator is still used for ambulation and gait speed was measured as 0.35m/s during this session.   ? Personal Factors and Comorbidities Age;Time since onset of injury/illness/exacerbation;Comorbidity 1   ? Examination-Activity Limitations Bend;Lift;Dressing;Squat;Carry;Locomotion Level;Caring for Others;Bed Mobility;Bathing;Sit;Stand;Stairs;Sleep   ? Examination-Participation Restrictions [employer]  Activity;Driving;Yard Work;Laundry;Cleaning   ? Stability/Clinical Decision Making Evolving/Moderate complexity   ? Rehab Potential Fair   ? PT Frequency 2x / week   ? PT Duration Other (comment)   7 weeks  ? PT Treatment/Interventions ADLs/Self Care Home Management;Electrical Stimulation;DME Instruction;Therapeutic activities;Functional mobility training;Stair training;Gait training;Therapeutic exercise;Balance  training;Neuromuscular re-education;Patient/family education;Manual techniques;Passive range of motion;Joint Manipulations   ? PT Next Visit Plan Perform progress note during next session. Address Lt ankle DF limitations with stretching and/or manual therapy and follow up with tibialis anterior strengthening. Continue with manual therapy addressing anterior hip tightness and provide handout assuming the patient is able to demonstrate independent setup.   ? PT Home Exercise Plan Ankle Pumps in Elevation - 3-5 x daily - 7 x weekly - 2 sets - 15 reps  Seated Knee Extension AROM - 2-3 x daily - 7 x weekly - 3 sets - 10 reps  Seated Heel Slide - 2-3 x daily - 7 x weekly - 3 sets - 8 reps  Mini Squat with Chair - 2-3 x daily - 7 x weekly - 2 sets - 8 reps  Standing March with Counter Support - 2-3 x daily - 7 x weekly - 2 sets - 14 reps Supine Diaphragmatic Breathing - 4-6 x daily - 7 x weekly - 1 sets - 12 reps  Seated Diaphragmatic Breathing - 4-6 x daily - 7 x weekly - 1 sets - 12 reps   ? Consulted and Agree with Plan of Care Patient   ? Family Member Consulted Wife   ? ?  ?  ? ?  ? ? ?Patient will benefit from skilled therapeutic intervention in order to improve the following deficits and impairments:  Abnormal gait, Decreased balance, Decreased endurance, Decreased mobility, Difficulty walking, Hypomobility, Increased muscle spasms, Decreased range of motion, Decreased activity tolerance, Decreased strength, Pain, Postural dysfunction, Impaired flexibility, Impaired tone, Impaired sensation ? ?Visit Diagnosis: ?S/P lumbar fusion ? ?Muscle weakness (generalized) ? ?Difficulty in walking, not elsewhere classified ? ?Chronic bilateral low back pain, unspecified whether sciatica present ? ? ? ? ?Problem List ?There are no problems to display for this patient. ? ? ?Creig Hines, PT ?09/15/2021, 12:40 PM ? ?[place] ?[facility] ?[address] ?[place], [place], [address] ?Phone: [phone]    Fax:  [phone] ? ?Name: John Jordan ?MRN: [id number] ?Date of Birth: [birth year] ? ? ? ?

## 2021-09-17 ENCOUNTER — Ambulatory Visit (HOSPITAL_COMMUNITY): Payer: No Typology Code available for payment source | Admitting: Physical Therapy

## 2021-09-17 ENCOUNTER — Other Ambulatory Visit: Payer: Self-pay

## 2021-09-17 DIAGNOSIS — Z981 Arthrodesis status: Secondary | ICD-10-CM | POA: Diagnosis not present

## 2021-09-17 NOTE — Therapy (Signed)
[place] [facility] ?[address] ?[place], [place], [address] ?Phone: [phone]   Fax:  [phone] ? ?Physical Therapy Treatment ? ?Patient Details  ?Name: John Jordan ?MRN: [id number] ?Date of Birth: May 30, 1961 ?Referring Provider (PT): Eulis Foster, [place] ? ? ?Encounter Date: 09/17/2021 ? ? PT End of Session - 09/17/21 1131   ? ? Visit Number 11   ? Number of Visits 15   ? Date for PT Re-Evaluation 09/14/21   ? [facility] Type [facility]   ? Authorization - Visit Number 10   ? Authorization - Number of Visits 15   ? Progress Note Due on Visit 10   ? PT Start Time 1117   ? PT Stop Time 1155   ? PT Time Calculation (min) 38 min   ? Equipment Utilized During Treatment Back brace;Other (comment)   L AFO  ? Activity Tolerance Patient limited by pain;Patient tolerated treatment well   ? Behavior During Therapy [facility] for tasks assessed/performed;Restless   ? ?  ?  ? ?  ? ? ?Past Medical History:  ?Diagnosis Date  ? Allergy   ? seasonal allergies  ? Anxiety   ? Arthritis   ? generalized  ? Depression   ? Diverticulitis   ? GERD (gastroesophageal reflux disease)   ? on meds  ? Hypertension   ? on meds  ? ? ?Past Surgical History:  ?Procedure Laterality Date  ? BACK SURGERY    ? [facility]  [facility]  [facility]  ? HERNIA REPAIR Bilateral   ? bilateral inguinal hernia repair  ? WISDOM TOOTH EXTRACTION    ? ? ?There were no vitals filed for this visit. ? ? Subjective Assessment - 09/17/21 1227   ? ? Subjective Patient reports that he is feeling well this morning although the stiffness persists it appears to take less time to reduce after standing up and walking around his home.   ? Limitations Sitting;Standing;Walking;Lifting;House hold activities   ? How long can you sit comfortably? 5 minutes   ? How long can you stand comfortably? 2 minutes   ? How long can you walk comfortably? 2 minutes   ? Currently in Pain? Yes   ? Pain Score 2    ?  Pain Location Back   ? Pain Orientation Right   ? Pain Onset More than a month ago   ? ?  ?  ? ?  ? ? ? ? ? ? ? ? ? ? ? ? ? ? ? ? ? ? ? ? [facility] Adult PT Treatment/Exercise - 09/17/21 0001   ? ?  ? Therapeutic Activites   ? Therapeutic Activities Other Therapeutic Activities   Stair negotiation: Normal reciprocal 3x2RT, Reverse descending reciprocal 2x2RT, Reverse both directions reciprocal 2x3RT, Reverse both direction LLE bias 2x3RT, Lateral step down 1x5(LLE)  ?  ? Lumbar Exercises: Aerobic  ? Nustep x8.93min   ? ?  ?  ? ?  ? ? ? ? ? ? ? ? ? ? ? ? PT Short Term Goals - 07/27/21 1308   ? ?  ? PT SHORT TERM GOAL #1  ? Title Patient will achieve a Lt DF MMT of at least 4-.   ? Time 3   ? Period Weeks   ? Status New   ? Target Date 08/17/21   ?  ? PT SHORT TERM GOAL #2  ? Title Patient will achieve a Lt hip flexion MMT  of at least 4-.   ? Time 3   ? Period Weeks   ? Status New   ? Target Date 08/17/21   ?  ? PT SHORT TERM GOAL #3  ? Title Patient will achieve an average resting low back pain level of no more than 4/10.   ? Time 3   ? Period Weeks   ? Status New   ? Target Date 08/17/21   ? ?  ?  ? ?  ? ? ? ? PT Long Term Goals - 07/27/21 1311   ? ?  ? PT LONG TERM GOAL #1  ? Title Patient will achieve a Lt ankle DF MMT of at least 4+.   ? Time 7   ? Period Weeks   ? Status New   ? Target Date 09/14/21   ?  ? PT LONG TERM GOAL #2  ? Title Patient will achieve a Lt hip flexion MMT of at least 4+.   ? Time 7   ? Period Weeks   ? Status New   ? Target Date 09/14/21   ?  ? PT LONG TERM GOAL #3  ? Title Patient will achieve a average resting low back pain level of no more than 2/10.   ? Time 7   ? Period Weeks   ? Status New   ? Target Date 09/14/21   ?  ? PT LONG TERM GOAL #4  ? Title Patient will be able to ambulate at least 226ft without the use of a rollator or low back pain greater than a 3/10.   ? Time 7   ? Period Weeks   ? Status New   ? Target Date 09/14/21   ?  ? PT LONG TERM GOAL #5  ? Title Patient will be  independent with his HEP.   ? Time 7   ? Period Days   ? Status New   ? Target Date 09/14/21   ? ?  ?  ? ?  ? ? ? ? ? ? ? ? Plan - 09/17/21 1228   ? ? Clinical Impression Statement Patient tolerated today's treatment very well and demonstrated an improved endurance and strength capacity for the LLE. Stair training on the 4in steps comprised the majority of today's session and he was even able to negotiate these steps reciprocally. Per patient request, stretching was not performed as the patient was feeling loose enough following completion of the NuStep. Fatigue in the quad of the LLE was noted by the end of the session, but this again had a much higher capcity than in previous visits.   ? Personal Factors and Comorbidities Age;Time since onset of injury/illness/exacerbation;Comorbidity 1   ? Examination-Activity Limitations Bend;Lift;Dressing;Squat;Carry;Locomotion Level;Caring for Others;Bed Mobility;Bathing;Sit;Stand;Stairs;Sleep   ? Examination-Participation Restrictions Community Activity;Driving;Yard Work;Laundry;Cleaning   ? Stability/Clinical Decision Making Evolving/Moderate complexity   ? Rehab Potential Fair   ? PT Frequency 2x / week   ? PT Duration Other (comment)   7 weeks  ? PT Treatment/Interventions ADLs/Self Care Home Management;Electrical Stimulation;DME Instruction;Therapeutic activities;Functional mobility training;Stair training;Gait training;Therapeutic exercise;Balance training;Neuromuscular re-education;Patient/family education;Manual techniques;Passive range of motion;Joint Manipulations   ? PT Next Visit Plan Continue with stair training and endurance of the LLE quadricep and glutes.   ? PT Home Exercise Plan Ankle Pumps in Elevation - 3-5 x daily - 7 x weekly - 2 sets - 15 reps  Seated Knee Extension AROM - 2-3 x daily - 7 x weekly - 3 sets - 10  reps  Seated Heel Slide - 2-3 x daily - 7 x weekly - 3 sets - 8 reps  Mini Squat with Chair - 2-3 x daily - 7 x weekly - 2 sets - 8 reps   Standing March with Counter Support - 2-3 x daily - 7 x weekly - 2 sets - 14 reps Supine Diaphragmatic Breathing - 4-6 x daily - 7 x weekly - 1 sets - 12 reps  Seated Diaphragmatic Breathing - 4-6 x daily - 7 x weekly - 1 sets - 12 reps   ? Consulted and Agree with Plan of Care Patient   ? Family Member Consulted Wife   ? ?  ?  ? ?  ? ? ?Patient will benefit from skilled therapeutic intervention in order to improve the following deficits and impairments:  Abnormal gait, Decreased balance, Decreased endurance, Decreased mobility, Difficulty walking, Hypomobility, Increased muscle spasms, Decreased range of motion, Decreased activity tolerance, Decreased strength, Pain, Postural dysfunction, Impaired flexibility, Impaired tone, Impaired sensation ? ?Visit Diagnosis: ?S/P lumbar fusion ? ?Muscle weakness (generalized) ? ?Difficulty in walking, not elsewhere classified ? ?Chronic bilateral low back pain, unspecified whether sciatica present ? ? ? ? ?Problem List ?There are no problems to display for this patient. ? ? ?Creig Hinesonnor  Guyla Bless, PT ?09/17/2021, 12:35 PM ? ?[place] [facility] ?[address] ?[place], [place], [address] ?Phone: [phone]   Fax:  [phone] ? ?Name: Thalia PartyCharles Slomski ?MRN: [id number] ?Date of Birth: 12/05/1960 ? ? ? ?

## 2021-09-24 ENCOUNTER — Ambulatory Visit (HOSPITAL_COMMUNITY): Payer: No Typology Code available for payment source | Admitting: Physical Therapy

## 2021-11-15 DIAGNOSIS — R7303 Prediabetes: Secondary | ICD-10-CM | POA: Insufficient documentation

## 2021-12-02 ENCOUNTER — Encounter (HOSPITAL_COMMUNITY): Payer: Self-pay | Admitting: Physical Therapy

## 2021-12-02 NOTE — Therapy (Signed)
[place], [place], [address] Phone: [phone]   Fax:  [phone]  Patient Details  Name: John Jordan MRN: [id number] Date of Birth: January 21, 1961 Referring Provider:  No ref. provider found  Encounter Date: 12/02/2021  PHYSICAL THERAPY DISCHARGE SUMMARY  Visits from Start of Care: 11  Current functional level related to goals / functional outcomes: Unknown as pt did not return for appointment.    Remaining deficits: Unknown as pt did not return for appointment.      Education / Equipment: HEP   Patient agrees to discharge. Patient goals were  unknown . Patient is being discharged due to not returning since the last visit.   Rayetta Humphrey, PT CLT [phone]  12/02/2021, 10:50 AM  [place], [place], [address] Phone: [phone]   Fax:  [phone]

## 2022-01-19 ENCOUNTER — Ambulatory Visit (HOSPITAL_COMMUNITY): Payer: No Typology Code available for payment source | Attending: Orthopaedic Surgery

## 2022-01-19 ENCOUNTER — Encounter (HOSPITAL_COMMUNITY): Payer: Self-pay

## 2022-01-19 DIAGNOSIS — G8929 Other chronic pain: Secondary | ICD-10-CM | POA: Insufficient documentation

## 2022-01-19 DIAGNOSIS — Z981 Arthrodesis status: Secondary | ICD-10-CM | POA: Insufficient documentation

## 2022-01-19 DIAGNOSIS — M6281 Muscle weakness (generalized): Secondary | ICD-10-CM | POA: Insufficient documentation

## 2022-01-19 DIAGNOSIS — M545 Low back pain, unspecified: Secondary | ICD-10-CM | POA: Diagnosis present

## 2022-01-19 DIAGNOSIS — R262 Difficulty in walking, not elsewhere classified: Secondary | ICD-10-CM | POA: Insufficient documentation

## 2022-01-19 NOTE — Therapy (Signed)
OUTPATIENT PHYSICAL THERAPY THORACOLUMBAR EVALUATION   Patient Name: Dmario Russom MRN: [id number] DOB:09/22/1960, 61 y.o., male Today's Date: 01/19/2022   PT End of Session - 01/19/22 0902     Visit Number 1    Number of Visits 15    Date for PT Re-Evaluation 03/09/22    [facility] ([facility] visits)    Authorization - Visit Number 1    Authorization - Number of Visits 15    Progress Note Due on Visit 15    PT Start Time 0902    PT Stop Time 0945    PT Time Calculation (min) 43 min    Behavior During Therapy WFL for tasks assessed/performed             Past Medical History:  Diagnosis Date   Allergy    seasonal allergies   Anxiety    Arthritis    generalized   Depression    Diverticulitis    GERD (gastroesophageal reflux disease)    on meds   Hypertension    on meds   Past Surgical History:  Procedure Laterality Date   BACK SURGERY     [facility]   [facility]   HERNIA REPAIR Bilateral    bilateral inguinal hernia repair   WISDOM TOOTH EXTRACTION     There are no problems to display for this patient.   PCP:Dr. Margo Aye in [facility]   REFERRING PROVIDER: Sharolyn Douglas  REFERRING DIAG: PT eval and tx M43.6 Fusion of spine, lumbar region  per Dr. 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 Pt has been on lyrica, baclofen, oxy, reports that  without medication he is unable to move. Pt has back extension machine on loan from [place], also has squat assist machine. No new surgery's, no new changes since last being here, pt returns as he would like to continue to work on his function.  PERTINENT HISTORY:  Patient reports that his original L5-S1 laminectomy and fusion surgery was on 06/08/21, but he "had to be reopened" on 06/10/21 in  order to fix an issue with a nerve block overdose. Following the second surgery his RLE recovered to what he says is around "90%" but his LLE is lagging behind considerably. When questioned regarding bowel bladder dysfunction, he reported having to self apply a catheter prior to surgery. He does also report what sounds like saddle anesthesia as well, but that the physicians are aware of these symptoms. He states that the physician cleared him to drive and increased his lifting restriction to 25lbs, but he does still have the no bending, lifting, or twisting precautions. He was experiencing spasms in his low back yesterday to the point he almost went to the ER.   PAIN:  Are you having pain? Yes: NPRS scale:  at worst 10/10 current 5/10 Pain location: left side predominant in legs, hips and back  Pain description: tingling, numbness, sharp pain  Aggravating factors: certain movements regardless of sitting or standing  Relieving factors: medications help, vibration gun, heating pad   PRECAUTIONS: Other: lifting heavy weights  WEIGHT BEARING RESTRICTIONS No  FALLS:  Has patient fallen in last 6 months? Yes. Number of falls 3-4, last one 1 month ago while walking  LIVING ENVIRONMENT: Lives with: lives with their spouse Lives in: House/apartment Stairs: Yes: External: ramp currently installed steps; on right going up Has following equipment at home: Single point cane, Walker - 2 wheeled, and Shower bench  OCCUPATION: disabled   PLOF:  pt was fighting through pain for several years prior to surgery on 06/08/21 .  Last pt was able to walk correctly was right before surgery on 06/08/21.   PATIENT GOALS improve walking, try to be less dependent on wife such as showering, dressing, assistance with walking.    OBJECTIVE:   DIAGNOSTIC FINDINGS:  L5-S1: Greatest level of disc narrowing and disc desiccation with left eccentric endplate marrow signal changes and left eccentric disc bulge with osteophytic spurring. Similar small inferiorly dissecting central disc protrusion. Similar mild left foraminal stenosis without significant canal stenosis. Similar left far lateral/extraforaminal disc/osteophyte.   IMPRESSION: No substantial change in disc and facet degenerative change that is most notable at L5-S1 and detailed above.  PATIENT SURVEYS:  FOTO 35  SCREENING FOR RED FLAGS: Bowel or bladder incontinence: Yes: pt reports he has to self cath and has numbness into groin region Spinal tumors: No Cauda equina syndrome: No Compression fracture: No Abdominal aneurysm: No  COGNITION:  Overall cognitive status: Within functional limits for tasks assessed     SENSATION: Light touch: L2 and down on left side there is numbness, intact but altered from right LE   PALPATION: TTP left lower lumber spine along transverse processes  LUMBAR ROM:   Active  A/PROM  eval  Flexion Initial starting point, bisecting line of PSIS and 15cm above  6 cm  Extension Initial starting point, bisecting line of PSIS and 15cm above  1cm  Right lateral flexion 58cm to ground  Left lateral flexion 62 cm to ground  Right rotation   Left rotation    (Blank rows = not tested)    LOWER EXTREMITY MMT:    MMT Right eval Left eval  Hip flexion  3+ (supine)  Hip extension 5 4  Hip abduction 4 3  Hip adduction 4 3+  Hip internal rotation    Hip external rotation    Knee flexion 5 4  Knee extension 5 4  Ankle dorsiflexion 5 4  Ankle plantarflexion    Ankle inversion 5 4  Ankle eversion  5 5   (Blank rows = not  tested)  LUMBAR SPECIAL TESTS:  Slump test: Negative  FUNCTIONAL TESTS:  5 times sit to stand: 34.96 sec 2 minute walk test: 205'   Tranfers  Requires b/L UE support to rise. Can not stand without UE support GAIT: Distance walked: 205' Assistive device utilized:  walking stick Level of assistance: Modified independence Comments: decreased left knee flexion extension, shortened step, decreased cadence, decreased heel contact, decreased hip extension    TODAY'S TREATMENT  evaluation  PATIENT EDUCATION:  Education details: Patient educated on exam findings, POC, scope of PT Person educated: Patient Education method: Programmer, multimedia, Demonstration, and Handouts Education comprehension: verbalized understanding, returned demonstration, verbal cues required, and tactile cues required    HOME EXERCISE PROGRAM: Initiate next session  ASSESSMENT:  CLINICAL IMPRESSION: Patient a 61 y.o. y.o. male who was seen today for physical therapy evaluation and treatment for gait impairment, s/p L5-S1 lami and fusion 06/08/21. 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 This is patients second time in therapy. Pt presents with decreased left LE strength decreased muscle mass in left paraspinal in L5 region, pain, tenderness to palpation, altered sensation and numbness. Pt with limited ranges in lumber motion, pt with increased left hamstring tone. Pt with gait abnormalities and difficulty transferring, requires ue support to stand. Pt ambulates with RW in community for longer distance and uses walking stick for shorter distances. Pts objective measures place him in a fall risk category. Patient will benefit from skilled PT services to improve strength and mobility needed for improved functional mobility.     OBJECTIVE IMPAIRMENTS Abnormal gait, decreased activity tolerance, decreased balance, decreased coordination, decreased endurance, decreased mobility, difficulty  walking, decreased ROM, decreased strength, impaired flexibility, impaired sensation, impaired tone, and pain.   ACTIVITY LIMITATIONS carrying, lifting, bending, sitting, standing, squatting, sleeping, stairs, transfers, bed mobility, bathing, dressing, and locomotion level  PARTICIPATION LIMITATIONS: cleaning, laundry, driving, shopping, community activity, occupation, and yard work  PERSONAL FACTORS Time since onset of injury/illness/exacerbation and 3+ comorbidities:  Allergies, Anxiety or Panic Disorders, Arthritis, Back pain, BMI over 30, Depression, Gastrointestinal Disease, Hearing Impairment, High Blood Pressure, Kidney, Bladder, Prostate or Urination Problems,Prior Surgery, Sleep dysfunction  are also affecting patient's functional outcome.   REHAB POTENTIAL: Fair time since injury, second course of therapy, pain levels  CLINICAL DECISION MAKING: Stable/uncomplicated  EVALUATION COMPLEXITY: Moderate   GOALS: Goals reviewed with patient? No  SHORT TERM GOALS: Target date: 02/09/2022  Patient will be independent with HEP in order to improve functional outcomes. Baseline:  Goal status: INITIAL  2.  Patient will report at least 25% improvement in symptoms for improved quality of life. Baseline:  Goal status: INITIAL  3.  Patient will improve FTSS by 10 seconds to improve efficiency of tranfers. Baseline:  5 times sit to stand: 34.96 sec Goal status: INITIAL   LONG TERM GOALS: Target date: 03/09/2022   Patient will report at least 50% improvement in symptoms for improved quality of life. Baseline:  Goal status: INITIAL  2.  Patient will improve FOTO score by at least 10 points in order to indicate improved tolerance to activity. Baseline: 35 Goal status: INITIAL   4.  Patient will be able to ambulate at least 300' feet in in order to demonstrate improved tolerance to activity. Baseline:2 minute walk test: 205' Goal status: INITIAL   PLAN: PT FREQUENCY:  2x/week  PT DURATION: other: 7 weeks  PLANNED INTERVENTIONS: Therapeutic exercises, Therapeutic  activity, Neuromuscular re-education, Balance training, Gait training, Patient/Family education, Joint manipulation, Joint mobilization, Stair training, Orthotic/Fit training, DME instructions, Aquatic Therapy, Dry Needling, Electrical stimulation, Spinal manipulation, Spinal mobilization, Cryotherapy, Moist heat, Compression bandaging, scar mobilization, Splintting, Taping, Traction, Ultrasound, Ionotophoresis 4mg /ml Dexamethasone, and Manual therapy  PLAN FOR NEXT SESSION: initiate HEP for LE stretching, heel raises, split stance weightshift, hamstring strength, hip abd strength   Yarisa Lynam, PT 01/19/2022, 10:41 AM

## 2022-01-21 ENCOUNTER — Ambulatory Visit (HOSPITAL_COMMUNITY): Payer: No Typology Code available for payment source

## 2022-01-21 ENCOUNTER — Encounter (HOSPITAL_COMMUNITY): Payer: Self-pay

## 2022-01-21 DIAGNOSIS — M6281 Muscle weakness (generalized): Secondary | ICD-10-CM

## 2022-01-21 DIAGNOSIS — R262 Difficulty in walking, not elsewhere classified: Secondary | ICD-10-CM

## 2022-01-21 DIAGNOSIS — Z981 Arthrodesis status: Secondary | ICD-10-CM | POA: Diagnosis not present

## 2022-01-21 DIAGNOSIS — G8929 Other chronic pain: Secondary | ICD-10-CM

## 2022-01-21 NOTE — Therapy (Signed)
OUTPATIENT PHYSICAL THERAPY THORACOLUMBAR EVALUATION   Patient Name: John Jordan MRN: [id number] DOB:1961/07/03, 61 y.o., male Today's Date: 01/21/2022   PT End of Session - 01/21/22 1430     Visit Number 2    Number of Visits 15    Date for PT Re-Evaluation 03/09/22    [facility] ([facility] visits)    Authorization - Visit Number 2    Authorization - Number of Visits 15    Progress Note Due on Visit 15    PT Start Time 1430    PT Stop Time 1515    PT Time Calculation (min) 45 min    Behavior During Therapy WFL for tasks assessed/performed             Past Medical History:  Diagnosis Date   Allergy    seasonal allergies   Anxiety    Arthritis    generalized   Depression    Diverticulitis    GERD (gastroesophageal reflux disease)    on meds   Hypertension    on meds   Past Surgical History:  Procedure Laterality Date   BACK SURGERY     [facility]   HERNIA REPAIR Bilateral    bilateral inguinal hernia repair   WISDOM TOOTH EXTRACTION     There are no problems to display for this patient.   PCP:Dr. Margo Aye in [place]   REFERRING PROVIDER: Sharolyn Douglas  REFERRING DIAG: PT eval and tx M43.6 Fusion of spine, lumbar region  per Dr. 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 Pt has been on lyrica, baclofen, oxy, reports that  without medication he is unable to move. Pt has back extension machine on loan from [place], also has squat assist machine. No new surgery's, no new changes since last being here, pt returns as he would like to continue to work on his function.  PERTINENT HISTORY:  Patient reports that his original L5-S1 laminectomy and fusion surgery was on 06/08/21, but he "had to be reopened" on 06/10/21 in  order to fix an issue with a nerve block overdose. Following the second surgery his RLE recovered to what he says is around "90%" but his LLE is lagging behind considerably. When questioned regarding bowel bladder dysfunction, he reported having to self apply a catheter prior to surgery. He does also report what sounds like saddle anesthesia as well, but that the physicians are aware of these symptoms. He states that the physician cleared him to drive and increased his lifting restriction to 25lbs, but he does still have the no bending, lifting, or twisting precautions. He was experiencing spasms in his low back yesterday to the point he almost went to the ER.   PAIN:  Are you having pain? Yes: NPRS scale:  at worst 10/10 current 5/10 Pain location: left side predominant in legs, hips and back  Pain description: tingling, numbness, sharp pain  Aggravating factors: certain movements regardless of sitting or standing  Relieving factors: medications help, vibration gun, heating pad   PRECAUTIONS: Other: lifting heavy weights  WEIGHT BEARING RESTRICTIONS No  FALLS:  Has patient fallen in last 6 months? Yes. Number of falls 3-4, last one 1 month ago while walking  LIVING ENVIRONMENT: Lives with: lives with their spouse Lives in: House/apartment Stairs: Yes: External: ramp currently installed steps; on right going up Has following equipment at home: Single point cane, Walker - 2 wheeled, and Shower bench  OCCUPATION: disabled   PLOF:  pt was fighting through pain for several years prior to surgery on 06/08/21 .  Last pt was able to walk correctly was right before surgery on 06/08/21.   PATIENT GOALS improve walking, try to be less dependent on wife such as showering, dressing, assistance with walking.    OBJECTIVE:   DIAGNOSTIC FINDINGS:  L5-S1: Greatest level of disc narrowing and disc desiccation with left eccentric endplate marrow signal changes and left eccentric disc bulge with osteophytic spurring. Similar small inferiorly dissecting central disc protrusion. Similar mild left foraminal stenosis without significant canal stenosis. Similar left far lateral/extraforaminal disc/osteophyte.   IMPRESSION: No substantial change in disc and facet degenerative change that is most notable at L5-S1 and detailed above.  PATIENT SURVEYS:  FOTO 35  SCREENING FOR RED FLAGS: Bowel or bladder incontinence: Yes: pt reports he has to self cath and has numbness into groin region Spinal tumors: No Cauda equina syndrome: No Compression fracture: No Abdominal aneurysm: No  COGNITION:  Overall cognitive status: Within functional limits for tasks assessed     SENSATION: Light touch: L2 and down on left side there is numbness, intact but altered from right LE   PALPATION: TTP left lower lumber spine along transverse processes  LUMBAR ROM:   Active  A/PROM  eval  Flexion Initial starting point, bisecting line of PSIS and 15cm above  6 cm  Extension Initial starting point, bisecting line of PSIS and 15cm above  1cm  Right lateral flexion 58cm to ground  Left lateral flexion 62 cm to ground  Right rotation   Left rotation    (Blank rows = not tested)    LOWER EXTREMITY MMT:    MMT Right eval Left eval  Hip flexion  3+ (supine)  Hip extension 5 4  Hip abduction 4 3  Hip adduction 4 3+  Hip internal rotation    Hip external rotation    Knee flexion 5 4  Knee extension 5 4  Ankle dorsiflexion 5 4  Ankle plantarflexion    Ankle inversion 5 4  Ankle eversion  5 5   (Blank rows = not  tested)  LUMBAR SPECIAL TESTS:  Slump test: Negative  FUNCTIONAL TESTS:  5 times sit to stand: 34.96 sec 2 minute walk test: 205'   Tranfers  Requires b/L UE support to rise. Can not stand without UE support GAIT: Distance walked: 205' Assistive device utilized:  walking stick Level of assistance: Modified independence Comments: decreased left knee flexion extension, shortened step, decreased cadence, decreased heel contact, decreased hip extension    TODAY'S TREATMENT  01/21/22 Hamstring stretch Sktc stretch Single leg bridge Sidelying hip abd Seated figure 4 stretch Seated forward rolls Heel raises  Step stance weightshifts with UE support  Broken down gait in // bars focusing on hip extension, weightshift and heel contact b/l UE uspport-> uni Ue support-> no UE support-> out of bars with CG-> with walking stick x10 mins   evaluation  PATIENT EDUCATION:  Education details: Patient educated on exam findings, POC, scope of PT Person educated: Patient Education method: Consulting civil engineer, Demonstration, and Handouts Education comprehension: verbalized understanding, returned demonstration, verbal cues required, and tactile cues required    HOME EXERCISE PROGRAM: Access Code: [email or web address] URL: [email or web address] Date: 01/21/2022 Prepared by: Leota Jacobsen  Exercises - Supine Hamstring Stretch with Strap  - 2 x daily - 7 x weekly - 1 sets - 5 reps - 10sec hold - Supine Single Knee to Chest Stretch  - 2 x daily - 7 x weekly - 1 sets - 5 reps - 10 sec hold - Figure 4 Bridge  - 2 x daily - 7 x weekly - 2 sets - 10 reps - Sidelying Hip Abduction  - 2 x daily - 7 x weekly - 2 sets - 10 reps - Seated Piriformis Stretch with Trunk Bend  - 2 x daily - 7 x weekly - 1 sets - 5 reps - 10 sec hold - Seated Flexion Stretch with Swiss Ball  - 2 x daily - 7 x weekly - 1 sets - 15 reps - 5sec hold - Heel Raises with Counter Support  - 2 x daily - 7 x weekly - 3 sets - 10  reps - Staggered Stance Step Throughs  - 2 x daily - 7 x weekly - 3 sets - 10 reps  ASSESSMENT:  CLINICAL IMPRESSION: Patient tolerates session well. HEP updated, no questions at this time. Pt reports leg feeling looser post session. Pt demonstrates nice improvement in gait sequencing after practicing staggered stance weight shifts. Good carryover on weightshift to walking and smoother gait pattern.  Patient will benefit from skilled PT services to improve strength and mobility needed for improved functional mobility.     OBJECTIVE IMPAIRMENTS Abnormal gait, decreased activity tolerance, decreased balance, decreased coordination, decreased endurance, decreased mobility, difficulty walking, decreased ROM, decreased strength, impaired flexibility, impaired sensation, impaired tone, and pain.   ACTIVITY LIMITATIONS carrying, lifting, bending, sitting, standing, squatting, sleeping, stairs, transfers, bed mobility, bathing, dressing, and locomotion level  PARTICIPATION LIMITATIONS: cleaning, laundry, driving, shopping, community activity, occupation, and yard work  PERSONAL FACTORS Time since onset of injury/illness/exacerbation and 3+ comorbidities:  Allergies, Anxiety or Panic Disorders, Arthritis, Back pain, BMI over 30, Depression, Gastrointestinal Disease, Hearing Impairment, High Blood Pressure, Kidney, Bladder, Prostate or Urination Problems,Prior Surgery, Sleep dysfunction  are also affecting patient's functional outcome.   REHAB POTENTIAL: Fair time since injury, second course of therapy, pain levels  CLINICAL DECISION MAKING: Stable/uncomplicated  EVALUATION COMPLEXITY: Moderate   GOALS: Goals reviewed with patient? No  SHORT TERM GOALS:  Target date: 02/09/2022  Patient will be independent with HEP in order to improve functional outcomes. Baseline:  Goal status: INITIAL  2.  Patient will report at least 25% improvement in symptoms for improved quality of life. Baseline:  Goal  status: INITIAL  3.  Patient will improve FTSS by 10 seconds to improve efficiency of tranfers. Baseline:  5 times sit to stand: 34.96 sec Goal status: INITIAL   LONG TERM GOALS: Target date: 03/09/2022   Patient will report at least 50% improvement in symptoms for improved quality of life. Baseline:  Goal status: INITIAL  2.  Patient will improve FOTO score by at least 10 points in order to indicate improved tolerance to activity. Baseline: 35 Goal status: INITIAL   4.  Patient will be able to ambulate at least 300' feet in in order to demonstrate improved tolerance to activity. Baseline:2 minute walk test: 205' Goal status: INITIAL   PLAN: PT FREQUENCY: 2x/week  PT DURATION: other: 7 weeks  PLANNED INTERVENTIONS: Therapeutic exercises, Therapeutic activity, Neuromuscular re-education, Balance training, Gait training, Patient/Family education, Joint manipulation, Joint mobilization, Stair training, Orthotic/Fit training, DME instructions, Aquatic Therapy, Dry Needling, Electrical stimulation, Spinal manipulation, Spinal mobilization, Cryotherapy, Moist heat, Compression bandaging, scar mobilization, Splintting, Taping, Traction, Ultrasound, Ionotophoresis 4mg /ml Dexamethasone, and Manual therapy  PLAN FOR NEXT SESSION: initiate HEP for LE stretching, heel raises, split stance weightshift, hamstring strength, hip abd strength   Kendarrius Tanzi, PT 01/21/2022, 3:15 PM

## 2022-01-26 ENCOUNTER — Encounter (HOSPITAL_COMMUNITY): Payer: No Typology Code available for payment source | Admitting: Physical Therapy

## 2022-01-28 ENCOUNTER — Encounter (HOSPITAL_COMMUNITY): Payer: No Typology Code available for payment source | Admitting: Physical Therapy

## 2022-02-02 ENCOUNTER — Ambulatory Visit (HOSPITAL_COMMUNITY): Payer: No Typology Code available for payment source | Attending: Orthopaedic Surgery

## 2022-02-02 ENCOUNTER — Encounter (HOSPITAL_COMMUNITY): Payer: Self-pay

## 2022-02-02 DIAGNOSIS — R262 Difficulty in walking, not elsewhere classified: Secondary | ICD-10-CM | POA: Insufficient documentation

## 2022-02-02 DIAGNOSIS — M545 Low back pain, unspecified: Secondary | ICD-10-CM | POA: Insufficient documentation

## 2022-02-02 DIAGNOSIS — M6281 Muscle weakness (generalized): Secondary | ICD-10-CM | POA: Insufficient documentation

## 2022-02-02 DIAGNOSIS — G8929 Other chronic pain: Secondary | ICD-10-CM | POA: Diagnosis present

## 2022-02-02 DIAGNOSIS — Z981 Arthrodesis status: Secondary | ICD-10-CM | POA: Insufficient documentation

## 2022-02-02 NOTE — Therapy (Signed)
OUTPATIENT PHYSICAL THERAPY THORACOLUMBAR TREATMENT   Patient Name: John Jordan MRN: [id number] DOB:04-01-1961, 61 y.o., male Today's Date: 02/02/2022   PT End of Session - 02/02/22 0906     Visit Number 3    Number of Visits 15    Date for PT Re-Evaluation 03/09/22    [facility] ([facility] visits)    Authorization - Visit Number 3    Authorization - Number of Visits 15    Progress Note Due on Visit 15    PT Start Time 0900    PT Stop Time 0942    PT Time Calculation (min) 42 min    Activity Tolerance Patient tolerated treatment well    Behavior During Therapy WFL for tasks assessed/performed              Past Medical History:  Diagnosis Date   Allergy    seasonal allergies   Anxiety    Arthritis    generalized   Depression    Diverticulitis    GERD (gastroesophageal reflux disease)    on meds   Hypertension    on meds   Past Surgical History:  Procedure Laterality Date   BACK SURGERY     [facility]   HERNIA REPAIR Bilateral    bilateral inguinal hernia repair   WISDOM TOOTH EXTRACTION     There are no problems to display for this patient.   PCP:Dr. Margo Aye in [place]   REFERRING PROVIDER: Sharolyn Douglas  REFERRING DIAG: PT eval and tx M43.6 Fusion of spine, lumbar region  per Dr. Sharolyn Douglas   Rationale for Evaluation and Treatment Rehabilitation  THERAPY DIAG:  S/P lumbar fusion  Muscle weakness (generalized)  Difficulty in walking, not elsewhere classified  Chronic bilateral low back pain, unspecified whether sciatica present  ONSET DATE: 06/08/21  SUBJECTIVE:                                                                                                                                                                                           SUBJECTIVE STATEMENT: 02/02/22:  Pain scale 5/10 middle of back, incision and radicular symptoms Lt LE down to knee.  Reports  he has been consisted with HEP, wishes to review the gait exercise  Pt had back surgery 06/08/21, where pt reports he had nerve damage from having too much nerve blocking. Pt has been on lyrica, baclofen, oxy, reports that without medication he is unable to move. Pt has back extension machine on loan from [place], also has squat assist machine. No new surgery's, no new changes since  last being here, pt returns as he would like to continue to work on his function.  PERTINENT HISTORY:  Patient reports that his original L5-S1 laminectomy and fusion surgery was on 06/08/21, but he "had to be reopened" on 06/10/21 in order to fix an issue with a nerve block overdose. Following the second surgery his RLE recovered to what he says is around "90%" but his LLE is lagging behind considerably. When questioned regarding bowel bladder dysfunction, he reported having to self apply a catheter prior to surgery. He does also report what sounds like saddle anesthesia as well, but that the physicians are aware of these symptoms. He states that the physician cleared him to drive and increased his lifting restriction to 25lbs, but he does still have the no bending, lifting, or twisting precautions. He was experiencing spasms in his low back yesterday to the point he almost went to the ER.   PAIN:  Are you having pain? Yes: NPRS scale:  at worst 10/10 current 5/10 Pain location: left side predominant in legs, hips and back  Pain description: tingling, numbness, sharp pain, ache, soreness  Aggravating factors: certain movements regardless of sitting or standing  Relieving factors: medications help, vibration gun, heating pad   PRECAUTIONS: Other: lifting heavy weights  WEIGHT BEARING RESTRICTIONS No  FALLS:  Has patient fallen in last 6 months? Yes. Number of falls 3-4, last one 1 month ago while walking  LIVING ENVIRONMENT: Lives with: lives with their spouse Lives in: House/apartment Stairs: Yes: External: ramp currently  installed steps; on right going up Has following equipment at home: Single point cane, Walker - 2 wheeled, and Shower bench  OCCUPATION: disabled   PLOF:  pt was fighting through pain for several years prior to surgery on 06/08/21 . Last pt was able to walk correctly was right before surgery on 06/08/21.   PATIENT GOALS improve walking, try to be less dependent on wife such as showering, dressing, assistance with walking.    OBJECTIVE:   DIAGNOSTIC FINDINGS:  L5-S1: Greatest level of disc narrowing and disc desiccation with left eccentric endplate marrow signal changes and left eccentric disc bulge with osteophytic spurring. Similar small inferiorly dissecting central disc protrusion. Similar mild left foraminal stenosis without significant canal stenosis. Similar left far lateral/extraforaminal disc/osteophyte.   IMPRESSION: No substantial change in disc and facet degenerative change that is most notable at L5-S1 and detailed above.  PATIENT SURVEYS:  FOTO 35  SCREENING FOR RED FLAGS: Bowel or bladder incontinence: Yes: pt reports he has to self cath and has numbness into groin region Spinal tumors: No Cauda equina syndrome: No Compression fracture: No Abdominal aneurysm: No  COGNITION:  Overall cognitive status: Within functional limits for tasks assessed     SENSATION: Light touch: L2 and down on left side there is numbness, intact but altered from right LE   PALPATION: TTP left lower lumber spine along transverse processes  LUMBAR ROM:   Active  A/PROM  eval  Flexion Initial starting point, bisecting line of PSIS and 15cm above  6 cm  Extension Initial starting point, bisecting line of PSIS and 15cm above  1cm  Right lateral flexion 58cm to ground  Left lateral flexion 62 cm to ground  Right rotation   Left rotation    (Blank rows = not tested)    LOWER EXTREMITY MMT:    MMT Right eval Left eval  Hip flexion  3+ (supine)  Hip extension 5 4  Hip  abduction 4 3  Hip adduction 4 3+  Hip internal rotation    Hip external rotation    Knee flexion 5 4  Knee extension 5 4  Ankle dorsiflexion 5 4  Ankle plantarflexion    Ankle inversion 5 4  Ankle eversion 5 5   (Blank rows = not tested)  LUMBAR SPECIAL TESTS:  Slump test: Negative  FUNCTIONAL TESTS:  5 times sit to stand: 34.96 sec 2 minute walk test: 205'   Tranfers  Requires b/L UE support to rise. Can not stand without UE support GAIT: Distance walked: 205' Assistive device utilized:  walking stick Level of assistance: Modified independence Comments: decreased left knee flexion extension, shortened step, decreased cadence, decreased heel contact, decreased hip extension    TODAY'S TREATMENT  02/02/22: Standing: 200 ft with walking stick, cueing for proper hand to support Lt LE and sequence X 2 sets initially and at EOS Broken down gait in // bars to improve weight shifting, staggered weight shift, gait mechanics, hip extension with uni UE support then no UE  3D hip excursion (weight shifting, rotation, STS controlled with elevated height, extension) pain free range Heel raise incline slope Toe raise decline slope ABD with UE support 10x 3" Hip extension with UE support ab set 10x3" Knee flexion 10x 3" Lunge onto 6in step height 10x 3" with 1 HHA  01/21/22 Hamstring stretch Sktc stretch Single leg bridge Sidelying hip abd Seated figure 4 stretch Seated forward rolls Heel raises  Step stance weightshifts with UE support  Broken down gait in // bars focusing on hip extension, weightshift and heel contact b/l UE uspport-> uni Ue support-> no UE support-> out of bars with CG-> with walking stick x10 mins   evaluation  PATIENT EDUCATION:  Education details: Patient educated on exam findings, POC, scope of PT Person educated: Patient Education method: Programmer, multimedia, Demonstration, and Handouts Education comprehension: verbalized understanding, returned  demonstration, verbal cues required, and tactile cues required    HOME EXERCISE PROGRAM: Access Code: [email or web address] URL: [email or web address] Date: 01/21/2022 Prepared by: Aleatha Borer  Exercises - Supine Hamstring Stretch with Strap  - 2 x daily - 7 x weekly - 1 sets - 5 reps - 10sec hold - Supine Single Knee to Chest Stretch  - 2 x daily - 7 x weekly - 1 sets - 5 reps - 10 sec hold - Figure 4 Bridge  - 2 x daily - 7 x weekly - 2 sets - 10 reps - Sidelying Hip Abduction  - 2 x daily - 7 x weekly - 2 sets - 10 reps - Seated Piriformis Stretch with Trunk Bend  - 2 x daily - 7 x weekly - 1 sets - 5 reps - 10 sec hold - Seated Flexion Stretch with Swiss Ball  - 2 x daily - 7 x weekly - 1 sets - 15 reps - 5sec hold - Heel Raises with Counter Support  - 2 x daily - 7 x weekly - 3 sets - 10 reps - Staggered Stance Step Throughs  - 2 x daily - 7 x weekly - 3 sets - 10 reps  02/02/22: begin walking program focus on good mechanics 3D hip excursion (weight shift, rotation, STS and extend)  ASSESSMENT:  CLINICAL IMPRESSION: Began session with gait training with multimodal cueing to improve weight shifting and overall mechanics, good carryover and smooth gait pattern.  Added gluteal and hamstring strengthening exercises to POC.  Cueing to look up and reduce flexed hip position through session.  Added 3D  hip excursion and walking program to HEP with printout given.    OBJECTIVE IMPAIRMENTS Abnormal gait, decreased activity tolerance, decreased balance, decreased coordination, decreased endurance, decreased mobility, difficulty walking, decreased ROM, decreased strength, impaired flexibility, impaired sensation, impaired tone, and pain.   ACTIVITY LIMITATIONS carrying, lifting, bending, sitting, standing, squatting, sleeping, stairs, transfers, bed mobility, bathing, dressing, and locomotion level  PARTICIPATION LIMITATIONS: cleaning, laundry, driving, shopping, community activity,  occupation, and yard work  PERSONAL FACTORS Time since onset of injury/illness/exacerbation and 3+ comorbidities:  Allergies, Anxiety or Panic Disorders, Arthritis, Back pain, BMI over 30, Depression, Gastrointestinal Disease, Hearing Impairment, High Blood Pressure, Kidney, Bladder, Prostate or Urination Problems,Prior Surgery, Sleep dysfunction  are also affecting patient's functional outcome.   REHAB POTENTIAL: Fair time since injury, second course of therapy, pain levels  CLINICAL DECISION MAKING: Stable/uncomplicated  EVALUATION COMPLEXITY: Moderate   GOALS: Goals reviewed with patient? No  SHORT TERM GOALS: Target date: 02/09/2022  Patient will be independent with HEP in order to improve functional outcomes. Baseline:  Goal status: INITIAL  2.  Patient will report at least 25% improvement in symptoms for improved quality of life. Baseline:  Goal status: INITIAL  3.  Patient will improve FTSS by 10 seconds to improve efficiency of tranfers. Baseline:  5 times sit to stand: 34.96 sec Goal status: INITIAL   LONG TERM GOALS: Target date: 03/09/2022   Patient will report at least 50% improvement in symptoms for improved quality of life. Baseline:  Goal status: INITIAL  2.  Patient will improve FOTO score by at least 10 points in order to indicate improved tolerance to activity. Baseline: 35 Goal status: INITIAL   4.  Patient will be able to ambulate at least 300' feet in in order to demonstrate improved tolerance to activity. Baseline:2 minute walk test: 205' Goal status: INITIAL   PLAN: PT FREQUENCY: 2x/week  PT DURATION: other: 7 weeks  PLANNED INTERVENTIONS: Therapeutic exercises, Therapeutic activity, Neuromuscular re-education, Balance training, Gait training, Patient/Family education, Joint manipulation, Joint mobilization, Stair training, Orthotic/Fit training, DME instructions, Aquatic Therapy, Dry Needling, Electrical stimulation, Spinal manipulation,  Spinal mobilization, Cryotherapy, Moist heat, Compression bandaging, scar mobilization, Splintting, Taping, Traction, Ultrasound, Ionotophoresis 4mg /ml Dexamethasone, and Manual therapy  PLAN FOR NEXT SESSION: initiate HEP for LE stretching, heel raises, split stance weightshift, hamstring strength, hip abd strength  , LPTA/CLT; CBIS [phone]  [phone], PTA 02/02/2022, 9:48 AM

## 2022-02-04 ENCOUNTER — Ambulatory Visit (HOSPITAL_COMMUNITY): Payer: No Typology Code available for payment source | Admitting: Physical Therapy

## 2022-02-04 DIAGNOSIS — M6281 Muscle weakness (generalized): Secondary | ICD-10-CM

## 2022-02-04 DIAGNOSIS — Z981 Arthrodesis status: Secondary | ICD-10-CM | POA: Diagnosis not present

## 2022-02-04 DIAGNOSIS — R262 Difficulty in walking, not elsewhere classified: Secondary | ICD-10-CM

## 2022-02-04 NOTE — Therapy (Signed)
OUTPATIENT PHYSICAL THERAPY THORACOLUMBAR TREATMENT   Patient Name: John Jordan MRN: [id number] DOB:1960-09-20, 61 y.o., male Today's Date: 02/04/2022   PT End of Session - 02/04/22 1521     Visit Number 4    Number of Visits 15    Date for PT Re-Evaluation 03/09/22    [facility] (15 visits)    Authorization - Visit Number 4    Authorization - Number of Visits 15    Progress Note Due on Visit 15    PT Start Time 1518    PT Stop Time 1556    PT Time Calculation (min) 38 min    Activity Tolerance Patient tolerated treatment well    Behavior During Therapy WFL for tasks assessed/performed              Past Medical History:  Diagnosis Date   Allergy    seasonal allergies   Anxiety    Arthritis    generalized   Depression    Diverticulitis    GERD (gastroesophageal reflux disease)    on meds   Hypertension    on meds   Past Surgical History:  Procedure Laterality Date   BACK SURGERY     [facility]   [facility]   HERNIA REPAIR Bilateral    bilateral inguinal hernia repair   WISDOM TOOTH EXTRACTION     There are no problems to display for this patient.   PCP:Dr. Margo Aye in [place]   REFERRING PROVIDER: Sharolyn Douglas  REFERRING DIAG: PT eval and tx M43.6 Fusion of spine, lumbar region  per Dr. Sharolyn Douglas   Rationale for Evaluation and Treatment Rehabilitation  THERAPY DIAG:  S/P lumbar fusion  Muscle weakness (generalized)  Difficulty in walking, not elsewhere classified  ONSET DATE: 06/08/21  SUBJECTIVE:                                                                                                                                                                                           SUBJECTIVE STATEMENT: Patient reports ongoing back pain. HEP is "alright". Reports ongoing trouble with LE weakness impacting walking.   Pt had back surgery 06/08/21, where pt reports he had nerve  damage from having too much nerve blocking. Pt has been on lyrica, baclofen, oxy, reports that without medication he is unable to move. Pt has back extension machine on loan from [place], also has squat assist machine. No new surgery's, no new changes since last being here, pt returns as he would like to continue to work on his function.  PERTINENT HISTORY:  Patient reports that his  original L5-S1 laminectomy and fusion surgery was on 06/08/21, but he "had to be reopened" on 06/10/21 in order to fix an issue with a nerve block overdose. Following the second surgery his RLE recovered to what he says is around "90%" but his LLE is lagging behind considerably. When questioned regarding bowel bladder dysfunction, he reported having to self apply a catheter prior to surgery. He does also report what sounds like saddle anesthesia as well, but that the physicians are aware of these symptoms. He states that the physician cleared him to drive and increased his lifting restriction to 25lbs, but he does still have the no bending, lifting, or twisting precautions. He was experiencing spasms in his low back yesterday to the point he almost went to the ER.   PAIN:  Are you having pain? Yes: NPRS scale:  at worst 10/10 current 5/10 Pain location: left side predominant in legs, hips and back  Pain description: tingling, numbness, sharp pain, ache, soreness  Aggravating factors: certain movements regardless of sitting or standing  Relieving factors: medications help, vibration gun, heating pad   PRECAUTIONS: Other: lifting heavy weights  WEIGHT BEARING RESTRICTIONS No  FALLS:  Has patient fallen in last 6 months? Yes. Number of falls 3-4, last one 1 month ago while walking  LIVING ENVIRONMENT: Lives with: lives with their spouse Lives in: House/apartment Stairs: Yes: External: ramp currently installed steps; on right going up Has following equipment at home: Single point cane, Walker - 2 wheeled, and Shower  bench  OCCUPATION: disabled   PLOF:  pt was fighting through pain for several years prior to surgery on 06/08/21 . Last pt was able to walk correctly was right before surgery on 06/08/21.   PATIENT GOALS improve walking, try to be less dependent on wife such as showering, dressing, assistance with walking.    OBJECTIVE:   DIAGNOSTIC FINDINGS:  L5-S1: Greatest level of disc narrowing and disc desiccation with left eccentric endplate marrow signal changes and left eccentric disc bulge with osteophytic spurring. Similar small inferiorly dissecting central disc protrusion. Similar mild left foraminal stenosis without significant canal stenosis. Similar left far lateral/extraforaminal disc/osteophyte.   IMPRESSION: No substantial change in disc and facet degenerative change that is most notable at L5-S1 and detailed above.  PATIENT SURVEYS:  FOTO 35  SCREENING FOR RED FLAGS: Bowel or bladder incontinence: Yes: pt reports he has to self cath and has numbness into groin region Spinal tumors: No Cauda equina syndrome: No Compression fracture: No Abdominal aneurysm: No  COGNITION:  Overall cognitive status: Within functional limits for tasks assessed     SENSATION: Light touch: L2 and down on left side there is numbness, intact but altered from right LE   PALPATION: TTP left lower lumber spine along transverse processes  LUMBAR ROM:   Active  A/PROM  eval  Flexion Initial starting point, bisecting line of PSIS and 15cm above  6 cm  Extension Initial starting point, bisecting line of PSIS and 15cm above  1cm  Right lateral flexion 58cm to ground  Left lateral flexion 62 cm to ground  Right rotation   Left rotation    (Blank rows = not tested)    LOWER EXTREMITY MMT:    MMT Right eval Left eval  Hip flexion  3+ (supine)  Hip extension 5 4  Hip abduction 4 3  Hip adduction 4 3+  Hip internal rotation    Hip external rotation    Knee flexion 5 4  Knee extension  5 4  Ankle dorsiflexion 5 4  Ankle plantarflexion    Ankle inversion 5 4  Ankle eversion 5 5   (Blank rows = not tested)  LUMBAR SPECIAL TESTS:  Slump test: Negative  FUNCTIONAL TESTS:  5 times sit to stand: 34.96 sec 2 minute walk test: 205'   Tranfers  Requires b/L UE support to rise. Can not stand without UE support GAIT: Distance walked: 205' Assistive device utilized:  walking stick Level of assistance: Modified independence Comments: decreased left knee flexion extension, shortened step, decreased cadence, decreased heel contact, decreased hip extension    TODAY'S TREATMENT  02/04/22 Heel raise Toe raise Step up 4 inch 2 x 10 HHA x 2  Standing hip abduction 10 x 3" with UE support Standing hip extension 10 x 3" with UE support Partial lunge to 6 inch step 2 x 10 each Retro walk 3 plates V95    Clinic ambulation 1.5 RT in clinic with walking stick (cues for heel strike on LT)   Nustep (seat 10) lv 3 6 min   02/02/22: Standing: 200 ft with walking stick, cueing for proper hand to support Lt LE and sequence X 2 sets initially and at EOS Broken down gait in // bars to improve weight shifting, staggered weight shift, gait mechanics, hip extension with uni UE support then no UE  3D hip excursion (weight shifting, rotation, STS controlled with elevated height, extension) pain free range Heel raise incline slope Toe raise decline slope ABD with UE support 10x 3" Hip extension with UE support ab set 10x3" Knee flexion 10x 3" Lunge onto 6in step height 10x 3" with 1 HHA  01/21/22 Hamstring stretch Sktc stretch Single leg bridge Sidelying hip abd Seated figure 4 stretch Seated forward rolls Heel raises  Step stance weightshifts with UE support  Broken down gait in // bars focusing on hip extension, weightshift and heel contact b/l UE uspport-> uni Ue support-> no UE support-> out of bars with CG-> with walking stick x10 mins   evaluation  PATIENT EDUCATION:   Education details: Patient educated on exam findings, POC, scope of PT Person educated: Patient Education method: Programmer, multimedia, Demonstration, and Handouts Education comprehension: verbalized understanding, returned demonstration, verbal cues required, and tactile cues required    HOME EXERCISE PROGRAM: Access Code: [email or web address] URL: [email or web address] Date: 01/21/2022 Prepared by: Aleatha Borer  Exercises - Supine Hamstring Stretch with Strap  - 2 x daily - 7 x weekly - 1 sets - 5 reps - 10sec hold - Supine Single Knee to Chest Stretch  - 2 x daily - 7 x weekly - 1 sets - 5 reps - 10 sec hold - Figure 4 Bridge  - 2 x daily - 7 x weekly - 2 sets - 10 reps - Sidelying Hip Abduction  - 2 x daily - 7 x weekly - 2 sets - 10 reps - Seated Piriformis Stretch with Trunk Bend  - 2 x daily - 7 x weekly - 1 sets - 5 reps - 10 sec hold - Seated Flexion Stretch with Swiss Ball  - 2 x daily - 7 x weekly - 1 sets - 15 reps - 5sec hold - Heel Raises with Counter Support  - 2 x daily - 7 x weekly - 3 sets - 10 reps - Staggered Stance Step Throughs  - 2 x daily - 7 x weekly - 3 sets - 10 reps  02/02/22: begin walking program focus on good mechanics 3D hip excursion (weight shift,  rotation, STS and extend)  ASSESSMENT:  CLINICAL IMPRESSION: Patient tolerated session well today. Added retro walk and nu step for progressive LE strengthening. Patient with ongoing gait deficits, and requires verbal cues for increased stride length and heel strike on LLE. Patient will continue to benefit from skilled therapy services to reduce remaining deficits and improve functional ability.    OBJECTIVE IMPAIRMENTS Abnormal gait, decreased activity tolerance, decreased balance, decreased coordination, decreased endurance, decreased mobility, difficulty walking, decreased ROM, decreased strength, impaired flexibility, impaired sensation, impaired tone, and pain.   ACTIVITY LIMITATIONS carrying, lifting,  bending, sitting, standing, squatting, sleeping, stairs, transfers, bed mobility, bathing, dressing, and locomotion level  PARTICIPATION LIMITATIONS: cleaning, laundry, driving, shopping, community activity, occupation, and yard work  PERSONAL FACTORS Time since onset of injury/illness/exacerbation and 3+ comorbidities:  Allergies, Anxiety or Panic Disorders, Arthritis, Back pain, BMI over 30, Depression, Gastrointestinal Disease, Hearing Impairment, High Blood Pressure, Kidney, Bladder, Prostate or Urination Problems,Prior Surgery, Sleep dysfunction  are also affecting patient's functional outcome.   REHAB POTENTIAL: Fair time since injury, second course of therapy, pain levels  CLINICAL DECISION MAKING: Stable/uncomplicated  EVALUATION COMPLEXITY: Moderate   GOALS: Goals reviewed with patient? No  SHORT TERM GOALS: Target date: 02/09/2022  Patient will be independent with HEP in order to improve functional outcomes. Baseline:  Goal status: INITIAL  2.  Patient will report at least 25% improvement in symptoms for improved quality of life. Baseline:  Goal status: INITIAL  3.  Patient will improve FTSS by 10 seconds to improve efficiency of tranfers. Baseline:  5 times sit to stand: 34.96 sec Goal status: INITIAL   LONG TERM GOALS: Target date: 03/09/2022   Patient will report at least 50% improvement in symptoms for improved quality of life. Baseline:  Goal status: INITIAL  2.  Patient will improve FOTO score by at least 10 points in order to indicate improved tolerance to activity. Baseline: 35 Goal status: INITIAL   4.  Patient will be able to ambulate at least 300' feet in in order to demonstrate improved tolerance to activity. Baseline:2 minute walk test: 205' Goal status: INITIAL   PLAN: PT FREQUENCY: 2x/week  PT DURATION: other: 7 weeks  PLANNED INTERVENTIONS: Therapeutic exercises, Therapeutic activity, Neuromuscular re-education, Balance training, Gait  training, Patient/Family education, Joint manipulation, Joint mobilization, Stair training, Orthotic/Fit training, DME instructions, Aquatic Therapy, Dry Needling, Electrical stimulation, Spinal manipulation, Spinal mobilization, Cryotherapy, Moist heat, Compression bandaging, scar mobilization, Splintting, Taping, Traction, Ultrasound, Ionotophoresis 4mg /ml Dexamethasone, and Manual therapy  PLAN FOR NEXT SESSION: LE stretching, heel raises, split stance weightshift, hamstring strength, hip abd strength  3:22 PM, 02/04/22 04/06/22 PT DPT  Physical Therapist with [facility]  [facility]  [phone]

## 2022-02-09 ENCOUNTER — Encounter (HOSPITAL_COMMUNITY): Payer: Self-pay | Admitting: Physical Therapy

## 2022-02-09 ENCOUNTER — Ambulatory Visit (HOSPITAL_COMMUNITY): Payer: No Typology Code available for payment source | Admitting: Physical Therapy

## 2022-02-09 DIAGNOSIS — R262 Difficulty in walking, not elsewhere classified: Secondary | ICD-10-CM

## 2022-02-09 DIAGNOSIS — M545 Low back pain, unspecified: Secondary | ICD-10-CM

## 2022-02-09 DIAGNOSIS — Z981 Arthrodesis status: Secondary | ICD-10-CM

## 2022-02-09 DIAGNOSIS — M6281 Muscle weakness (generalized): Secondary | ICD-10-CM

## 2022-02-09 NOTE — Therapy (Signed)
OUTPATIENT PHYSICAL THERAPY THORACOLUMBAR TREATMENT   Patient Name: John Jordan MRN: [id number] DOB:10-15-1960, 61 y.o., male Today's Date: 02/09/2022   PT End of Session - 02/09/22 0900     Visit Number 5    Number of Visits 15    Date for PT Re-Evaluation 03/09/22    [facility] (15 visits)    Authorization - Visit Number 5    Authorization - Number of Visits 15    Progress Note Due on Visit 15    PT Start Time 0900    PT Stop Time [phone]    PT Time Calculation (min) 38 min    Activity Tolerance Patient tolerated treatment well    Behavior During Therapy WFL for tasks assessed/performed              Past Medical History:  Diagnosis Date   Allergy    seasonal allergies   Anxiety    Arthritis    generalized   Depression    Diverticulitis    GERD (gastroesophageal reflux disease)    on meds   Hypertension    on meds   Past Surgical History:  Procedure Laterality Date   BACK SURGERY     [facility]   [facility]   HERNIA REPAIR Bilateral    bilateral inguinal hernia repair   WISDOM TOOTH EXTRACTION     There are no problems to display for this patient.   PCP:Dr. Margo Aye in [place]   REFERRING PROVIDER: Sharolyn Douglas  REFERRING DIAG: PT eval and tx M43.6 Fusion of spine, lumbar region  per Dr. Sharolyn Douglas   Rationale for Evaluation and Treatment Rehabilitation  THERAPY DIAG:  S/P lumbar fusion  Muscle weakness (generalized)  Difficulty in walking, not elsewhere classified  Chronic bilateral low back pain, unspecified whether sciatica present  ONSET DATE: 06/08/21  SUBJECTIVE:                                                                                                                                                                                           SUBJECTIVE STATEMENT: Patient states he is doing alright. Exercises give him new things to work on. Continues to have extreme  pain throughout the day.  Pt had back surgery 06/08/21, where pt reports he had nerve damage from having too much nerve blocking. Pt has been on lyrica, baclofen, oxy, reports that without medication he is unable to move. Pt has back extension machine on loan from [place], also has squat assist machine. No new surgery's, no new changes since last being here, pt returns as he would like  to continue to work on his function.  PERTINENT HISTORY:  Patient reports that his original L5-S1 laminectomy and fusion surgery was on 06/08/21, but he "had to be reopened" on 06/10/21 in order to fix an issue with a nerve block overdose. Following the second surgery his RLE recovered to what he says is around "90%" but his LLE is lagging behind considerably. When questioned regarding bowel bladder dysfunction, he reported having to self apply a catheter prior to surgery. He does also report what sounds like saddle anesthesia as well, but that the physicians are aware of these symptoms. He states that the physician cleared him to drive and increased his lifting restriction to 25lbs, but he does still have the no bending, lifting, or twisting precautions. He was experiencing spasms in his low back yesterday to the point he almost went to the ER.   PAIN:  Are you having pain? Yes: NPRS scale:  at worst 10/10 current 6/10 Pain location: left side predominant in legs, hips and back  Pain description: tingling, numbness, sharp pain, ache, soreness  Aggravating factors: certain movements regardless of sitting or standing  Relieving factors: medications help, vibration gun, heating pad   PRECAUTIONS: Other: lifting heavy weights  WEIGHT BEARING RESTRICTIONS No  FALLS:  Has patient fallen in last 6 months? Yes. Number of falls 3-4, last one 1 month ago while walking  LIVING ENVIRONMENT: Lives with: lives with their spouse Lives in: House/apartment Stairs: Yes: External: ramp currently installed steps; on right going up Has  following equipment at home: Single point cane, Walker - 2 wheeled, and Shower bench  OCCUPATION: disabled   PLOF:  pt was fighting through pain for several years prior to surgery on 06/08/21 . Last pt was able to walk correctly was right before surgery on 06/08/21.   PATIENT GOALS improve walking, try to be less dependent on wife such as showering, dressing, assistance with walking.    OBJECTIVE:   DIAGNOSTIC FINDINGS:  L5-S1: Greatest level of disc narrowing and disc desiccation with left eccentric endplate marrow signal changes and left eccentric disc bulge with osteophytic spurring. Similar small inferiorly dissecting central disc protrusion. Similar mild left foraminal stenosis without significant canal stenosis. Similar left far lateral/extraforaminal disc/osteophyte.   IMPRESSION: No substantial change in disc and facet degenerative change that is most notable at L5-S1 and detailed above.  PATIENT SURVEYS:  FOTO 35  SCREENING FOR RED FLAGS: Bowel or bladder incontinence: Yes: pt reports he has to self cath and has numbness into groin region Spinal tumors: No Cauda equina syndrome: No Compression fracture: No Abdominal aneurysm: No  COGNITION:  Overall cognitive status: Within functional limits for tasks assessed     SENSATION: Light touch: L2 and down on left side there is numbness, intact but altered from right LE   PALPATION: TTP left lower lumber spine along transverse processes  LUMBAR ROM:   Active  A/PROM  eval  Flexion Initial starting point, bisecting line of PSIS and 15cm above  6 cm  Extension Initial starting point, bisecting line of PSIS and 15cm above  1cm  Right lateral flexion 58cm to ground  Left lateral flexion 62 cm to ground  Right rotation   Left rotation    (Blank rows = not tested)    LOWER EXTREMITY MMT:    MMT Right eval Left eval  Hip flexion  3+ (supine)  Hip extension 5 4  Hip abduction 4 3  Hip adduction 4 3+  Hip  internal rotation  Hip external rotation    Knee flexion 5 4  Knee extension 5 4  Ankle dorsiflexion 5 4  Ankle plantarflexion    Ankle inversion 5 4  Ankle eversion 5 5   (Blank rows = not tested)  LUMBAR SPECIAL TESTS:  Slump test: Negative  FUNCTIONAL TESTS:  5 times sit to stand: 34.96 sec 2 minute walk test: 205'   Tranfers  Requires b/L UE support to rise. Can not stand without UE support GAIT: Distance walked: 205' Assistive device utilized:  walking stick Level of assistance: Modified independence Comments: decreased left knee flexion extension, shortened step, decreased cadence, decreased heel contact, decreased hip extension    TODAY'S TREATMENT  02/09/22 HR 1x 20 TR 1x 20 Step up 6 inch 1x 20 bilateral with bilateral UE support Lateral step up 6 inch 2 x 10  Standing hip abduction RTB at knees 2x 10 bilateral  Standing hip extension RTB at ankles 2x 10 bilateral  STS 2x 10  Leg press 3 plates 2x 15 bilateral with emphasis on LLE Hamstring curls 2# 1x 20 bilateral    02/04/22 Heel raise Toe raise Step up 4 inch 2 x 10 HHA x 2  Standing hip abduction 10 x 3" with UE support Standing hip extension 10 x 3" with UE support Partial lunge to 6 inch step 2 x 10 each Retro walk 3 plates Z36    Clinic ambulation 1.5 RT in clinic with walking stick (cues for heel strike on LT)   Nustep (seat 10) lv 3 6 min   02/02/22: Standing: 200 ft with walking stick, cueing for proper hand to support Lt LE and sequence X 2 sets initially and at EOS Broken down gait in // bars to improve weight shifting, staggered weight shift, gait mechanics, hip extension with uni UE support then no UE  3D hip excursion (weight shifting, rotation, STS controlled with elevated height, extension) pain free range Heel raise incline slope Toe raise decline slope ABD with UE support 10x 3" Hip extension with UE support ab set 10x3" Knee flexion 10x 3" Lunge onto 6in step height 10x 3" with 1  HHA  01/21/22 Hamstring stretch Sktc stretch Single leg bridge Sidelying hip abd Seated figure 4 stretch Seated forward rolls Heel raises  Step stance weightshifts with UE support  Broken down gait in // bars focusing on hip extension, weightshift and heel contact b/l UE uspport-> uni Ue support-> no UE support-> out of bars with CG-> with walking stick x10 mins    PATIENT EDUCATION:  Education details: 02/09/22 HEP;  Patient educated on exam findings, POC, scope of PT Person educated: Patient Education method: Explanation, Demonstration, and Handouts Education comprehension: verbalized understanding, returned demonstration, verbal cues required, and tactile cues required    HOME EXERCISE PROGRAM: Access Code: [email or web address] URL: [email or web address] Date: 01/21/2022 Prepared by: Aleatha Borer  Exercises - Supine Hamstring Stretch with Strap  - 2 x daily - 7 x weekly - 1 sets - 5 reps - 10sec hold - Supine Single Knee to Chest Stretch  - 2 x daily - 7 x weekly - 1 sets - 5 reps - 10 sec hold - Figure 4 Bridge  - 2 x daily - 7 x weekly - 2 sets - 10 reps - Sidelying Hip Abduction  - 2 x daily - 7 x weekly - 2 sets - 10 reps - Seated Piriformis Stretch with Trunk Bend  - 2 x daily - 7 x weekly - 1 sets -  5 reps - 10 sec hold - Seated Flexion Stretch with Swiss Ball  - 2 x daily - 7 x weekly - 1 sets - 15 reps - 5sec hold - Heel Raises with Counter Support  - 2 x daily - 7 x weekly - 3 sets - 10 reps - Staggered Stance Step Throughs  - 2 x daily - 7 x weekly - 3 sets - 10 reps  02/02/22: begin walking program focus on good mechanics 3D hip excursion (weight shift, rotation, STS and extend)  ASSESSMENT:  CLINICAL IMPRESSION: Continued with bilateral LE strengthening and patient tolerating increased height step and reps of previously completed exercises. Added resistance to glute strengthening which patient is able to complete with good mechanics with minimal cueing.  Patient overall tolerates session well noting moderate fatigue at end of session. Patient will continue to benefit from physical therapy in order to improve function and reduce impairment.    OBJECTIVE IMPAIRMENTS Abnormal gait, decreased activity tolerance, decreased balance, decreased coordination, decreased endurance, decreased mobility, difficulty walking, decreased ROM, decreased strength, impaired flexibility, impaired sensation, impaired tone, and pain.   ACTIVITY LIMITATIONS carrying, lifting, bending, sitting, standing, squatting, sleeping, stairs, transfers, bed mobility, bathing, dressing, and locomotion level  PARTICIPATION LIMITATIONS: cleaning, laundry, driving, shopping, community activity, occupation, and yard work  PERSONAL FACTORS Time since onset of injury/illness/exacerbation and 3+ comorbidities:  Allergies, Anxiety or Panic Disorders, Arthritis, Back pain, BMI over 30, Depression, Gastrointestinal Disease, Hearing Impairment, High Blood Pressure, Kidney, Bladder, Prostate or Urination Problems,Prior Surgery, Sleep dysfunction  are also affecting patient's functional outcome.   REHAB POTENTIAL: Fair time since injury, second course of therapy, pain levels  CLINICAL DECISION MAKING: Stable/uncomplicated  EVALUATION COMPLEXITY: Moderate   GOALS: Goals reviewed with patient? No  SHORT TERM GOALS: Target date: 02/09/2022  Patient will be independent with HEP in order to improve functional outcomes. Baseline:  Goal status: INITIAL  2.  Patient will report at least 25% improvement in symptoms for improved quality of life. Baseline:  Goal status: INITIAL  3.  Patient will improve FTSS by 10 seconds to improve efficiency of tranfers. Baseline:  5 times sit to stand: 34.96 sec Goal status: INITIAL   LONG TERM GOALS: Target date: 03/09/2022   Patient will report at least 50% improvement in symptoms for improved quality of life. Baseline:  Goal status: INITIAL  2.   Patient will improve FOTO score by at least 10 points in order to indicate improved tolerance to activity. Baseline: 35 Goal status: INITIAL   4.  Patient will be able to ambulate at least 300' feet in in order to demonstrate improved tolerance to activity. Baseline:2 minute walk test: 205' Goal status: INITIAL   PLAN: PT FREQUENCY: 2x/week  PT DURATION: other: 7 weeks  PLANNED INTERVENTIONS: Therapeutic exercises, Therapeutic activity, Neuromuscular re-education, Balance training, Gait training, Patient/Family education, Joint manipulation, Joint mobilization, Stair training, Orthotic/Fit training, DME instructions, Aquatic Therapy, Dry Needling, Electrical stimulation, Spinal manipulation, Spinal mobilization, Cryotherapy, Moist heat, Compression bandaging, scar mobilization, Splintting, Taping, Traction, Ultrasound, Ionotophoresis 4mg /ml Dexamethasone, and Manual therapy  PLAN FOR NEXT SESSION: LE stretching, heel raises, split stance weightshift, hamstring strength, hip abd strength  9:01 AM, 02/09/22 04/11/22 PT, DPT Physical Therapist at [facility]

## 2022-02-11 ENCOUNTER — Ambulatory Visit (HOSPITAL_COMMUNITY): Payer: No Typology Code available for payment source | Admitting: Physical Therapy

## 2022-02-11 ENCOUNTER — Encounter (HOSPITAL_COMMUNITY): Payer: Self-pay | Admitting: Physical Therapy

## 2022-02-11 DIAGNOSIS — R262 Difficulty in walking, not elsewhere classified: Secondary | ICD-10-CM

## 2022-02-11 DIAGNOSIS — Z981 Arthrodesis status: Secondary | ICD-10-CM | POA: Diagnosis not present

## 2022-02-11 DIAGNOSIS — M6281 Muscle weakness (generalized): Secondary | ICD-10-CM

## 2022-02-11 DIAGNOSIS — G8929 Other chronic pain: Secondary | ICD-10-CM

## 2022-02-11 NOTE — Therapy (Signed)
OUTPATIENT PHYSICAL THERAPY THORACOLUMBAR TREATMENT   Patient Name: John Jordan MRN: [id number] DOB:1961/01/07, 61 y.o., male Today's Date: 02/11/2022   PT End of Session - 02/11/22 0900     Visit Number 6    Number of Visits 15    Date for PT Re-Evaluation 03/09/22    [facility] (15 visits)    Authorization - Visit Number 6    Authorization - Number of Visits 15    Progress Note Due on Visit 15    PT Start Time 0900    PT Stop Time 0940    PT Time Calculation (min) 40 min    Activity Tolerance Patient tolerated treatment well    Behavior During Therapy WFL for tasks assessed/performed              Past Medical History:  Diagnosis Date   Allergy    seasonal allergies   Anxiety    Arthritis    generalized   Depression    Diverticulitis    GERD (gastroesophageal reflux disease)    on meds   Hypertension    on meds   Past Surgical History:  Procedure Laterality Date   BACK SURGERY     [facility]   [facility]   HERNIA REPAIR Bilateral    bilateral inguinal hernia repair   WISDOM TOOTH EXTRACTION     There are no problems to display for this patient.   PCP:Dr. Margo Aye in [place]   REFERRING PROVIDER: Sharolyn Douglas  REFERRING DIAG: PT eval and tx M43.6 Fusion of spine, lumbar region  per Dr. Sharolyn Douglas   Rationale for Evaluation and Treatment Rehabilitation  THERAPY DIAG:  Muscle weakness (generalized)  Difficulty in walking, not elsewhere classified  Chronic bilateral low back pain, unspecified whether sciatica present  ONSET DATE: 06/08/21  SUBJECTIVE:                                                                                                                                                                                           SUBJECTIVE STATEMENT: Patient states things are about the same. He is practicing HEP and more mindful to keep from dragging feet when walking.    PERTINENT HISTORY:  Patient reports that his original L5-S1 laminectomy and fusion surgery was on 06/08/21, but he "had to be reopened" on 06/10/21 in order to fix an issue with a nerve block overdose. Following the second surgery his RLE recovered to what he says is around "90%" but his LLE is lagging behind considerably. When questioned regarding bowel bladder dysfunction, he reported having to self apply  a catheter prior to surgery. He does also report what sounds like saddle anesthesia as well, but that the physicians are aware of these symptoms. He states that the physician cleared him to drive and increased his lifting restriction to 25lbs, but he does still have the no bending, lifting, or twisting precautions. He was experiencing spasms in his low back yesterday to the point he almost went to the ER.   PAIN:  Are you having pain? Yes: NPRS scale:  5/10 Pain location: left side predominant in legs, hips and back  Pain description: tingling, numbness, sharp pain, ache, soreness  Aggravating factors: certain movements regardless of sitting or standing  Relieving factors: medications help, vibration gun, heating pad   PRECAUTIONS: Other: lifting heavy weights  WEIGHT BEARING RESTRICTIONS No  FALLS:  Has patient fallen in last 6 months? Yes. Number of falls 3-4, last one 1 month ago while walking  LIVING ENVIRONMENT: Lives with: lives with their spouse Lives in: House/apartment Stairs: Yes: External: ramp currently installed steps; on right going up Has following equipment at home: Single point cane, Walker - 2 wheeled, and Shower bench  OCCUPATION: disabled   PLOF:  pt was fighting through pain for several years prior to surgery on 06/08/21 . Last pt was able to walk correctly was right before surgery on 06/08/21.   PATIENT GOALS improve walking, try to be less dependent on wife such as showering, dressing, assistance with walking.    OBJECTIVE:   DIAGNOSTIC FINDINGS:  L5-S1:  Greatest level of disc narrowing and disc desiccation with left eccentric endplate marrow signal changes and left eccentric disc bulge with osteophytic spurring. Similar small inferiorly dissecting central disc protrusion. Similar mild left foraminal stenosis without significant canal stenosis. Similar left far lateral/extraforaminal disc/osteophyte.   IMPRESSION: No substantial change in disc and facet degenerative change that is most notable at L5-S1 and detailed above.  PATIENT SURVEYS:  FOTO 35  SCREENING FOR RED FLAGS: Bowel or bladder incontinence: Yes: pt reports he has to self cath and has numbness into groin region Spinal tumors: No Cauda equina syndrome: No Compression fracture: No Abdominal aneurysm: No  COGNITION:  Overall cognitive status: Within functional limits for tasks assessed     SENSATION: Light touch: L2 and down on left side there is numbness, intact but altered from right LE   PALPATION: TTP left lower lumber spine along transverse processes  LUMBAR ROM:   Active  A/PROM  eval  Flexion Initial starting point, bisecting line of PSIS and 15cm above  6 cm  Extension Initial starting point, bisecting line of PSIS and 15cm above  1cm  Right lateral flexion 58cm to ground  Left lateral flexion 62 cm to ground  Right rotation   Left rotation    (Blank rows = not tested)    LOWER EXTREMITY MMT:    MMT Right eval Left eval  Hip flexion  3+ (supine)  Hip extension 5 4  Hip abduction 4 3  Hip adduction 4 3+  Hip internal rotation    Hip external rotation    Knee flexion 5 4  Knee extension 5 4  Ankle dorsiflexion 5 4  Ankle plantarflexion    Ankle inversion 5 4  Ankle eversion 5 5   (Blank rows = not tested)  LUMBAR SPECIAL TESTS:  Slump test: Negative  FUNCTIONAL TESTS:  5 times sit to stand: 34.96 sec 2 minute walk test: 205'   Tranfers  Requires b/L UE support to rise. Can not  stand without UE support GAIT: Distance walked:  205' Assistive device utilized:  walking stick Level of assistance: Modified independence Comments: decreased left knee flexion extension, shortened step, decreased cadence, decreased heel contact, decreased hip extension    TODAY'S TREATMENT  02/11/22 HR 1x 20 TR 1x 20 Step up 6 inch 1x 20 bilateral with bilateral UE support Lateral step up 6 inch x 10 each Standing hip abduction 3lb 3 x 10 each Standing hip extension 3lb 3 x 10 each  LAQ 3lb 3 x 10 each   Retro walk 4 plates x 10 STS 2x 10  Hamstring curls 3lb 3 x 10 each   Tandem gait 4 RT    02/09/22 HR 1x 20 TR 1x 20 Step up 6 inch 1x 20 bilateral with bilateral UE support Lateral step up 6 inch 2 x 10  Standing hip abduction RTB at knees 2x 10 bilateral  Standing hip extension RTB at ankles 2x 10 bilateral  STS 2x 10  Leg press 3 plates 2x 15 bilateral with emphasis on LLE Hamstring curls 2# 1x 20 bilateral    02/04/22 Heel raise x20 Toe raise x20  Step up 4 inch 2 x 10 HHA x 2  Standing hip abduction 10 x 3" with UE support Standing hip extension 10 x 3" with UE support Partial lunge to 6 inch step 2 x 10 each Retro walk 3 plates x15    Clinic ambulation 1.5 RT in clinic with walking stick (cues for heel strike on LT)   Nustep (seat 10) lv 3 6 min   02/02/22: Standing: 200 ft with walking stick, cueing for proper hand to support Lt LE and sequence X 2 sets initially and at EOS Broken down gait in // bars to improve weight shifting, staggered weight shift, gait mechanics, hip extension with uni UE support then no UE  3D hip excursion (weight shifting, rotation, STS controlled with elevated height, extension) pain free range Heel raise incline slope Toe raise decline slope ABD with UE support 10x 3" Hip extension with UE support ab set 10x3" Knee flexion 10x 3" Lunge onto 6in step height 10x 3" with 1 HHA  01/21/22 Hamstring stretch Sktc stretch Single leg bridge Sidelying hip abd Seated figure 4  stretch Seated forward rolls Heel raises  Step stance weightshifts with UE support  Broken down gait in // bars focusing on hip extension, weightshift and heel contact b/l UE uspport-> uni Ue support-> no UE support-> out of bars with CG-> with walking stick x10 mins    PATIENT EDUCATION:  Education details: 02/09/22 HEP;  Patient educated on exam findings, POC, scope of PT Person educated: Patient Education method: Explanation, Demonstration, and Handouts Education comprehension: verbalized understanding, returned demonstration, verbal cues required, and tactile cues required    HOME EXERCISE PROGRAM: Access Code: [email or web address] URL: [email or web address] Date: 01/21/2022 Prepared by: Leota Jacobsen  Exercises - Supine Hamstring Stretch with Strap  - 2 x daily - 7 x weekly - 1 sets - 5 reps - 10sec hold - Supine Single Knee to Chest Stretch  - 2 x daily - 7 x weekly - 1 sets - 5 reps - 10 sec hold - Figure 4 Bridge  - 2 x daily - 7 x weekly - 2 sets - 10 reps - Sidelying Hip Abduction  - 2 x daily - 7 x weekly - 2 sets - 10 reps - Seated Piriformis Stretch with Trunk Bend  - 2 x daily - 7 x weekly -  1 sets - 5 reps - 10 sec hold - Seated Flexion Stretch with Swiss Ball  - 2 x daily - 7 x weekly - 1 sets - 15 reps - 5sec hold - Heel Raises with Counter Support  - 2 x daily - 7 x weekly - 3 sets - 10 reps - Staggered Stance Step Throughs  - 2 x daily - 7 x weekly - 3 sets - 10 reps  02/02/22: begin walking program focus on good mechanics 3D hip excursion (weight shift, rotation, STS and extend)  ASSESSMENT:  CLINICAL IMPRESSION: Patient demos good tolerance with today activity. Notable muscle fatigue on LLE during standing strengthening exercises. Patient challenged with tandem gait for balance but improved with practice. Overall, improving activity tolerance. Patient will continue to benefit from skilled therapy services to reduce remaining deficits and improve functional  ability.    OBJECTIVE IMPAIRMENTS Abnormal gait, decreased activity tolerance, decreased balance, decreased coordination, decreased endurance, decreased mobility, difficulty walking, decreased ROM, decreased strength, impaired flexibility, impaired sensation, impaired tone, and pain.   ACTIVITY LIMITATIONS carrying, lifting, bending, sitting, standing, squatting, sleeping, stairs, transfers, bed mobility, bathing, dressing, and locomotion level  PARTICIPATION LIMITATIONS: cleaning, laundry, driving, shopping, community activity, occupation, and yard work  PERSONAL FACTORS Time since onset of injury/illness/exacerbation and 3+ comorbidities:  Allergies, Anxiety or Panic Disorders, Arthritis, Back pain, BMI over 30, Depression, Gastrointestinal Disease, Hearing Impairment, High Blood Pressure, Kidney, Bladder, Prostate or Urination Problems,Prior Surgery, Sleep dysfunction  are also affecting patient's functional outcome.   REHAB POTENTIAL: Fair time since injury, second course of therapy, pain levels  CLINICAL DECISION MAKING: Stable/uncomplicated  EVALUATION COMPLEXITY: Moderate   GOALS: Goals reviewed with patient? No  SHORT TERM GOALS: Target date: 02/09/2022  Patient will be independent with HEP in order to improve functional outcomes. Baseline:  Goal status: INITIAL  2.  Patient will report at least 25% improvement in symptoms for improved quality of life. Baseline:  Goal status: INITIAL  3.  Patient will improve FTSS by 10 seconds to improve efficiency of tranfers. Baseline:  5 times sit to stand: 34.96 sec Goal status: INITIAL   LONG TERM GOALS: Target date: 03/09/2022   Patient will report at least 50% improvement in symptoms for improved quality of life. Baseline:  Goal status: INITIAL  2.  Patient will improve FOTO score by at least 10 points in order to indicate improved tolerance to activity. Baseline: 35 Goal status: INITIAL   4.  Patient will be able to  ambulate at least 300' feet in in order to demonstrate improved tolerance to activity. Baseline:2 minute walk test: 205' Goal status: INITIAL   PLAN: PT FREQUENCY: 2x/week  PT DURATION: other: 7 weeks  PLANNED INTERVENTIONS: Therapeutic exercises, Therapeutic activity, Neuromuscular re-education, Balance training, Gait training, Patient/Family education, Joint manipulation, Joint mobilization, Stair training, Orthotic/Fit training, DME instructions, Aquatic Therapy, Dry Needling, Electrical stimulation, Spinal manipulation, Spinal mobilization, Cryotherapy, Moist heat, Compression bandaging, scar mobilization, Splintting, Taping, Traction, Ultrasound, Ionotophoresis 4mg /ml Dexamethasone, and Manual therapy  PLAN FOR NEXT SESSION: LE stretching, heel raises, split stance weightshift, hamstring strength, hip abd strength  9:41 AM, 02/11/22 04/13/22 PT DPT  Physical Therapist with   [facility]  [phone]

## 2022-02-15 ENCOUNTER — Ambulatory Visit (HOSPITAL_COMMUNITY): Payer: No Typology Code available for payment source | Admitting: Physical Therapy

## 2022-02-16 ENCOUNTER — Telehealth (HOSPITAL_COMMUNITY): Payer: Self-pay | Admitting: Physical Therapy

## 2022-02-16 NOTE — Telephone Encounter (Signed)
Patient no show on 8/14. Spoke with patient regarding appointment and he stated he was unable to get to appointment in time. Patient confirmed next appointment time.   1:29 PM, 02/16/22 Wyman Songster PT, DPT Physical Therapist at [facility]

## 2022-02-17 ENCOUNTER — Ambulatory Visit (HOSPITAL_COMMUNITY): Payer: No Typology Code available for payment source

## 2022-02-17 ENCOUNTER — Encounter (HOSPITAL_COMMUNITY): Payer: Self-pay

## 2022-02-17 DIAGNOSIS — G8929 Other chronic pain: Secondary | ICD-10-CM

## 2022-02-17 DIAGNOSIS — R262 Difficulty in walking, not elsewhere classified: Secondary | ICD-10-CM

## 2022-02-17 DIAGNOSIS — M6281 Muscle weakness (generalized): Secondary | ICD-10-CM

## 2022-02-17 DIAGNOSIS — Z981 Arthrodesis status: Secondary | ICD-10-CM | POA: Diagnosis not present

## 2022-02-17 NOTE — Therapy (Signed)
OUTPATIENT PHYSICAL THERAPY THORACOLUMBAR TREATMENT   Patient Name: John Jordan MRN: [id number] DOB:March 05, 1961, 61 y.o., male Today's Date: 02/17/2022   PT End of Session - 02/17/22 1047     Visit Number 7    Number of Visits 15    Date for PT Re-Evaluation 03/09/22    [facility] (15 visits)    Authorization - Visit Number 7    Authorization - Number of Visits 15    Progress Note Due on Visit 15    PT Start Time [phone]    PT Stop Time 1038    PT Time Calculation (min) 47 min    Activity Tolerance Patient tolerated treatment well    Behavior During Therapy WFL for tasks assessed/performed               Past Medical History:  Diagnosis Date   Allergy    seasonal allergies   Anxiety    Arthritis    generalized   Depression    Diverticulitis    GERD (gastroesophageal reflux disease)    on meds   Hypertension    on meds   Past Surgical History:  Procedure Laterality Date   BACK SURGERY     [facility]  [facility]   [facility]   HERNIA REPAIR Bilateral    bilateral inguinal hernia repair   WISDOM TOOTH EXTRACTION     There are no problems to display for this patient.   PCP:Dr. Nevada Crane in [place] PROVIDER: Rennis Harding Next apt: 02/19/22  REFERRING DIAG: PT eval and tx M43.6 Fusion of spine, lumbar region  per Dr. Rennis Harding   Rationale for Evaluation and Treatment Rehabilitation  THERAPY DIAG:  Muscle weakness (generalized)  Difficulty in walking, not elsewhere classified  Chronic bilateral low back pain, unspecified whether sciatica present  S/P lumbar fusion  ONSET DATE: 06/08/21  SUBJECTIVE:                                                                                                                                                                                           SUBJECTIVE STATEMENT: Pt stated LBP and numbness just below Lt knee, achey numbness and stiffness in  lower back.  Reports he has been compliant with HEP and has 2 machines for LE and back extension strengthening.  Pt concerned with balance, difficulty sleeping through night. Returns to MD this Friday.   Feels he has made improvements by 35-40%.  Walks with cane majority of day, does use RW in mornings.  Reports 1 fall months ago from legs giving out, feels stronger.  PERTINENT  HISTORY:  Patient reports that his original L5-S1 laminectomy and fusion surgery was on 06/08/21, but he "had to be reopened" on 06/10/21 in order to fix an issue with a nerve block overdose. Following the second surgery his RLE recovered to what he says is around "90%" but his LLE is lagging behind considerably. When questioned regarding bowel bladder dysfunction, he reported having to self apply a catheter prior to surgery. He does also report what sounds like saddle anesthesia as well, but that the physicians are aware of these symptoms. He states that the physician cleared him to drive and increased his lifting restriction to 25lbs, but he does still have the no bending, lifting, or twisting precautions. He was experiencing spasms in his low back yesterday to the point he almost went to the ER.   PAIN:  Are you having pain? Yes: NPRS scale:  5/10 Pain location: left side predominant in legs, hips and back  Pain description: tingling, numbness, sharp pain, ache, soreness  Aggravating factors: certain movements regardless of sitting or standing  Relieving factors: medications help, vibration gun, heating pad   PRECAUTIONS: Other: lifting heavy weights  WEIGHT BEARING RESTRICTIONS No  FALLS:  Has patient fallen in last 6 months? Yes. Number of falls 3-4, last one 1 month ago while walking  LIVING ENVIRONMENT: Lives with: lives with their spouse Lives in: House/apartment Stairs: Yes: External: ramp currently installed steps; on right going up Has following equipment at home: Single point cane, Walker - 2 wheeled, and  Shower bench  OCCUPATION: disabled   PLOF:  pt was fighting through pain for several years prior to surgery on 06/08/21 . Last pt was able to walk correctly was right before surgery on 06/08/21.   PATIENT GOALS improve walking, try to be less dependent on wife such as showering, dressing, assistance with walking.    OBJECTIVE:   DIAGNOSTIC FINDINGS:  L5-S1: Greatest level of disc narrowing and disc desiccation with left eccentric endplate marrow signal changes and left eccentric disc bulge with osteophytic spurring. Similar small inferiorly dissecting central disc protrusion. Similar mild left foraminal stenosis without significant canal stenosis. Similar left far lateral/extraforaminal disc/osteophyte.   IMPRESSION: No substantial change in disc and facet degenerative change that is most notable at L5-S1 and detailed above.  PATIENT SURVEYS:  FOTO 35  02/17/22: 42.48%functional  SCREENING FOR RED FLAGS: Bowel or bladder incontinence: Yes: pt reports he has to self cath and has numbness into groin region Spinal tumors: No Cauda equina syndrome: No Compression fracture: No Abdominal aneurysm: No  COGNITION:  Overall cognitive status: Within functional limits for tasks assessed     SENSATION: Light touch: L2 and down on left side there is numbness, intact but altered from right LE   PALPATION: TTP left lower lumber spine along transverse processes  LUMBAR ROM:   Active  A/PROM  eval AROM 02/17/22  Flexion Initial starting point, bisecting line of PSIS and 15cm above  6 cm 15cm from floor  Extension Initial starting point, bisecting line of PSIS and 15cm above  1cm 8 degrees  Right lateral flexion 58cm to ground 50cm to ground  Left lateral flexion 62 cm to ground 55cm to ground  Right rotation    Left rotation     (Blank rows = not tested)    LOWER EXTREMITY MMT:    MMT Right eval Left eval Right 02/17/22 Left  02/17/22  Hip flexion  3+ (supine) 4+ supine 4+/5   Hip extension 5 4 5/5  sidelying 4/5 sidelying  Hip abduction 4 3 4+ 4+  Hip adduction 4 3+ 4/5 4+/5  Hip internal rotation      Hip external rotation      Knee flexion _0 4+  Knee extension _1 4+  Ankle dorsiflexion _2 4+  Ankle plantarflexion      Ankle inversion 5 4 5/5 4/5  Ankle eversion 5 5     (Blank rows = not tested)  LUMBAR SPECIAL TESTS:  Slump test: Negative  FUNCTIONAL TESTS:  5 times sit to stand: 34.96 sec 2 minute walk test: 205'   Tranfers  Requires b/L UE support to rise. Can not stand without UE support GAIT: Distance walked: 205' Assistive device utilized:  walking stick Level of assistance: Modified independence Comments: decreased left knee flexion extension, shortened step, decreased cadence, decreased heel contact, decreased hip extension    TODAY'S TREATMENT  02/17/22:  3D hip excursion (20x weight shifting, rotation, 10x squat front of mat (cueing for equal weight bearing), extension)  Retro walk 4 plates x 5RT  Sidestep 4Pl 5RT each  Reviewed goals   2MWT  MMT  ROM  5STS  FOTO  02/11/22 HR 1x 20 TR 1x 20 Step up 6 inch 1x 20 bilateral with bilateral UE support Lateral step up 6 inch x 10 each Standing hip abduction 3lb 3 x 10 each Standing hip extension 3lb 3 x 10 each  LAQ 3lb 3 x 10 each   Retro walk 4 plates x 10 STS 2x 10  Hamstring curls 3lb 3 x 10 each   Tandem gait 4 RT    02/09/22 HR 1x 20 TR 1x 20 Step up 6 inch 1x 20 bilateral with bilateral UE support Lateral step up 6 inch 2 x 10  Standing hip abduction RTB at knees 2x 10 bilateral  Standing hip extension RTB at ankles 2x 10 bilateral  STS 2x 10  Leg press 3 plates 2x 15 bilateral with emphasis on LLE Hamstring curls 2# 1x 20 bilateral    02/04/22 Heel raise x20 Toe raise x20  Step up 4 inch 2 x 10 HHA x 2  Standing hip abduction 10 x 3" with UE support Standing hip extension 10 x 3" with UE support Partial lunge to 6 inch step 2 x 10 each Retro  walk 3 plates x15    Clinic ambulation 1.5 RT in clinic with walking stick (cues for heel strike on LT)   Nustep (seat 10) lv 3 6 min   02/02/22: Standing: 200 ft with walking stick, cueing for proper hand to support Lt LE and sequence X 2 sets initially and at EOS Broken down gait in // bars to improve weight shifting, staggered weight shift, gait mechanics, hip extension with uni UE support then no UE  3D hip excursion (weight shifting, rotation, STS controlled with elevated height, extension) pain free range Heel raise incline slope Toe raise decline slope ABD with UE support 10x 3" Hip extension with UE support ab set 10x3" Knee flexion 10x 3" Lunge onto 6in step height 10x 3" with 1 HHA  01/21/22 Hamstring stretch Sktc stretch Single leg bridge Sidelying hip abd Seated figure 4 stretch Seated forward rolls Heel raises  Step stance weightshifts with UE support  Broken down gait in // bars focusing on hip extension, weightshift and heel contact b/l UE uspport-> uni Ue support-> no UE support-> out of bars with CG-> with walking stick x10 mins  PATIENT EDUCATION:  Education details: 02/09/22 HEP;  Patient educated on exam findings, POC, scope of PT Person educated: Patient Education method: Explanation, Demonstration, and Handouts Education comprehension: verbalized understanding, returned demonstration, verbal cues required, and tactile cues required    HOME EXERCISE PROGRAM: Access Code: [email or web address] URL: [email or web address] Date: 01/21/2022 Prepared by: Leota Jacobsen  Exercises - Supine Hamstring Stretch with Strap  - 2 x daily - 7 x weekly - 1 sets - 5 reps - 10sec hold - Supine Single Knee to Chest Stretch  - 2 x daily - 7 x weekly - 1 sets - 5 reps - 10 sec hold - Figure 4 Bridge  - 2 x daily - 7 x weekly - 2 sets - 10 reps - Sidelying Hip Abduction  - 2 x daily - 7 x weekly - 2 sets - 10 reps - Seated Piriformis Stretch with Trunk Bend  - 2 x  daily - 7 x weekly - 1 sets - 5 reps - 10 sec hold - Seated Flexion Stretch with Swiss Ball  - 2 x daily - 7 x weekly - 1 sets - 15 reps - 5sec hold - Heel Raises with Counter Support  - 2 x daily - 7 x weekly - 3 sets - 10 reps - Staggered Stance Step Throughs  - 2 x daily - 7 x weekly - 3 sets - 10 reps  02/02/22: begin walking program focus on good mechanics 3D hip excursion (weight shift, rotation, STS and extend)  ASSESSMENT:  CLINICAL IMPRESSION: Reviewed goals prior MD apt on Friday with the following findings:  Pt met 3/3 STGs and 1/3 LTGs.  Pt reports compliance with HEP daily, improving ROM and strength.  Pt continues to walk with antalgic gait mechanics, safely with LRAD (SPC) and increased cadence noted in 2MWT.  Vast improvements with 5STS with no HHA required.  Pt continues to be limited by c/o achey, stiff LBP and numbness in Lt LE.  Pt will continue to benefit from skilled intervention to address weakness, balance and mobility.   OBJECTIVE IMPAIRMENTS Abnormal gait, decreased activity tolerance, decreased balance, decreased coordination, decreased endurance, decreased mobility, difficulty walking, decreased ROM, decreased strength, impaired flexibility, impaired sensation, impaired tone, and pain.   ACTIVITY LIMITATIONS carrying, lifting, bending, sitting, standing, squatting, sleeping, stairs, transfers, bed mobility, bathing, dressing, and locomotion level  PARTICIPATION LIMITATIONS: cleaning, laundry, driving, shopping, community activity, occupation, and yard work  PERSONAL FACTORS Time since onset of injury/illness/exacerbation and 3+ comorbidities:  Allergies, Anxiety or Panic Disorders, Arthritis, Back pain, BMI over 30, Depression, Gastrointestinal Disease, Hearing Impairment, High Blood Pressure, Kidney, Bladder, Prostate or Urination Problems,Prior Surgery, Sleep dysfunction  are also affecting patient's functional outcome.   REHAB POTENTIAL: Fair time since injury,  second course of therapy, pain levels  CLINICAL DECISION MAKING: Stable/uncomplicated  EVALUATION COMPLEXITY: Moderate   GOALS: Goals reviewed with patient? No  SHORT TERM GOALS: Target date: 02/09/2022  Patient will be independent with HEP in order to improve functional outcomes. Baseline: 02/17/22:  Reports compliance with HEP 5x/ week Goal status: MET  2.  Patient will report at least 25% improvement in symptoms for improved quality of life. Baseline: 02/17/22:  35-40% improvements Goal status: MET  3.  Patient will improve FTSS by 10 seconds to improve efficiency of tranfers. Baseline:  02/17/22: 5 STS 19.06" standard height no HHA required; eval was: 5 times sit to stand: 34.96 sec Goal status: MET   LONG TERM GOALS: Target date: 03/09/2022   Patient  will report at least 50% improvement in symptoms for improved quality of life. Baseline: 02/17/22:  35-40% improvements Goal status: IN PROGRESS  2.  Patient will improve FOTO score by at least 10 points in order to indicate improved tolerance to activity. Baseline: Eval: 35%, 02/17/22: 42.48% Goal status: IN PROGRESS   4.  Patient will be able to ambulate at least 300' feet in 2MWT in order to demonstrate improved tolerance to activity. Baseline:02/17/22: 2MWT 318f with cane; eval: 2 minute walk test: 205' Goal status: MET   PLAN: PT FREQUENCY: 2x/week  PT DURATION: other: 7 weeks  PLANNED INTERVENTIONS: Therapeutic exercises, Therapeutic activity, Neuromuscular re-education, Balance training, Gait training, Patient/Family education, Joint manipulation, Joint mobilization, Stair training, Orthotic/Fit training, DME instructions, Aquatic Therapy, Dry Needling, Electrical stimulation, Spinal manipulation, Spinal mobilization, Cryotherapy, Moist heat, Compression bandaging, scar mobilization, Splintting, Taping, Traction, Ultrasound, Ionotophoresis 422mml Dexamethasone, and Manual therapy  PLAN FOR NEXT SESSION: LE stretching,  heel raises, split stance weightshift, hamstring strength, hip abd strength CaIhor AustinLPTA/CLT; CBIS [id number] AM, 02/17/22

## 2022-02-23 ENCOUNTER — Ambulatory Visit (HOSPITAL_COMMUNITY): Payer: No Typology Code available for payment source | Admitting: Physical Therapy

## 2022-02-23 ENCOUNTER — Encounter (HOSPITAL_COMMUNITY): Payer: Self-pay | Admitting: Physical Therapy

## 2022-02-23 DIAGNOSIS — G8929 Other chronic pain: Secondary | ICD-10-CM

## 2022-02-23 DIAGNOSIS — R262 Difficulty in walking, not elsewhere classified: Secondary | ICD-10-CM

## 2022-02-23 DIAGNOSIS — Z981 Arthrodesis status: Secondary | ICD-10-CM | POA: Diagnosis not present

## 2022-02-23 DIAGNOSIS — M6281 Muscle weakness (generalized): Secondary | ICD-10-CM

## 2022-02-23 NOTE — Therapy (Signed)
OUTPATIENT PHYSICAL THERAPY THORACOLUMBAR TREATMENT   Patient Name: John Jordan MRN: [id number] DOB:02-25-1961, 61 y.o., male Today's Date: 02/23/2022   PT End of Session - 02/23/22 1036     Visit Number 8    Number of Visits 15    Date for PT Re-Evaluation 03/09/22    [facility] (15 visits)    Authorization - Visit Number 8    Authorization - Number of Visits 15    Progress Note Due on Visit 15    PT Start Time 1033    PT Stop Time 1053    PT Time Calculation (min) 20 min    Activity Tolerance Patient tolerated treatment well    Behavior During Therapy WFL for tasks assessed/performed               Past Medical History:  Diagnosis Date   Allergy    seasonal allergies   Anxiety    Arthritis    generalized   Depression    Diverticulitis    GERD (gastroesophageal reflux disease)    on meds   Hypertension    on meds   Past Surgical History:  Procedure Laterality Date   BACK SURGERY     [facility]   [facility]   HERNIA REPAIR Bilateral    bilateral inguinal hernia repair   WISDOM TOOTH EXTRACTION     There are no problems to display for this patient.   PCP:Dr. Nevada Crane in [place] PROVIDER: Rennis Harding Next apt: 02/19/22  REFERRING DIAG: PT eval and tx M43.6 Fusion of spine, lumbar region  per Dr. Rennis Harding   Rationale for Evaluation and Treatment Rehabilitation  THERAPY DIAG:  Muscle weakness (generalized)  Difficulty in walking, not elsewhere classified  Chronic bilateral low back pain, unspecified whether sciatica present  ONSET DATE: 06/08/21  SUBJECTIVE:                                                                                                                                                                                           SUBJECTIVE STATEMENT: Doing well, no new issues. Doing exercises at home without issue.   PERTINENT HISTORY:  Patient  reports that his original L5-S1 laminectomy and fusion surgery was on 06/08/21, but he "had to be reopened" on 06/10/21 in order to fix an issue with a nerve block overdose. Following the second surgery his RLE recovered to what he says is around "90%" but his LLE is lagging behind considerably. When questioned regarding bowel bladder dysfunction, he reported having to self apply a catheter prior to surgery. He  does also report what sounds like saddle anesthesia as well, but that the physicians are aware of these symptoms. He states that the physician cleared him to drive and increased his lifting restriction to 25lbs, but he does still have the no bending, lifting, or twisting precautions. He was experiencing spasms in his low back yesterday to the point he almost went to the ER.   PAIN:  Are you having pain? Yes: NPRS scale:  5/10 Pain location: left side predominant in legs, hips and back  Pain description: tingling, numbness, sharp pain, ache, soreness  Aggravating factors: certain movements regardless of sitting or standing  Relieving factors: medications help, vibration gun, heating pad   PRECAUTIONS: Other: lifting heavy weights  WEIGHT BEARING RESTRICTIONS No  FALLS:  Has patient fallen in last 6 months? Yes. Number of falls 3-4, last one 1 month ago while walking  LIVING ENVIRONMENT: Lives with: lives with their spouse Lives in: House/apartment Stairs: Yes: External: ramp currently installed steps; on right going up Has following equipment at home: Single point cane, Walker - 2 wheeled, and Shower bench  OCCUPATION: disabled   PLOF:  pt was fighting through pain for several years prior to surgery on 06/08/21 . Last pt was able to walk correctly was right before surgery on 06/08/21.   PATIENT GOALS improve walking, try to be less dependent on wife such as showering, dressing, assistance with walking.    OBJECTIVE:   DIAGNOSTIC FINDINGS:  L5-S1: Greatest level of disc narrowing  and disc desiccation with left eccentric endplate marrow signal changes and left eccentric disc bulge with osteophytic spurring. Similar small inferiorly dissecting central disc protrusion. Similar mild left foraminal stenosis without significant canal stenosis. Similar left far lateral/extraforaminal disc/osteophyte.   IMPRESSION: No substantial change in disc and facet degenerative change that is most notable at L5-S1 and detailed above.  PATIENT SURVEYS:  FOTO 35  02/17/22: 42.48%functional  SCREENING FOR RED FLAGS: Bowel or bladder incontinence: Yes: pt reports he has to self cath and has numbness into groin region Spinal tumors: No Cauda equina syndrome: No Compression fracture: No Abdominal aneurysm: No  COGNITION:  Overall cognitive status: Within functional limits for tasks assessed     SENSATION: Light touch: L2 and down on left side there is numbness, intact but altered from right LE   PALPATION: TTP left lower lumber spine along transverse processes  LUMBAR ROM:   Active  A/PROM  eval AROM 02/17/22  Flexion Initial starting point, bisecting line of PSIS and 15cm above  6 cm 15cm from floor  Extension Initial starting point, bisecting line of PSIS and 15cm above  1cm 8 degrees  Right lateral flexion 58cm to ground 50cm to ground  Left lateral flexion 62 cm to ground 55cm to ground  Right rotation    Left rotation     (Blank rows = not tested)    LOWER EXTREMITY MMT:    MMT Right eval Left eval Right 02/17/22 Left  02/17/22  Hip flexion  3+ (supine) 4+ supine 4+/5  Hip extension 5 4 5/5 sidelying 4/5 sidelying  Hip abduction 4 3 4+ 4+  Hip adduction 4 3+ 4/5 4+/5  Hip internal rotation      Hip external rotation      Knee flexion _0 4+  Knee extension _1 4+  Ankle dorsiflexion _2 4+  Ankle plantarflexion      Ankle inversion 5 4 5/5 4/5  Ankle eversion 5 5     (  Blank rows = not tested)  LUMBAR SPECIAL TESTS:  Slump test:  Negative  FUNCTIONAL TESTS:  5 times sit to stand: 34.96 sec 2 minute walk test: 205'   Tranfers  Requires b/L UE support to rise. Can not stand without UE support GAIT: Distance walked: 205' Assistive device utilized:  walking stick Level of assistance: Modified independence Comments: decreased left knee flexion extension, shortened step, decreased cadence, decreased heel contact, decreased hip extension    TODAY'S TREATMENT  02/23/22 Nu step lv 4 4 min warmup  Weighted walkouts 4 plates retro/ side/ side 10 x each  Leg press 3/4/5 plates x 10 each (increased low back pain with 5 plates) HS curl machine 6 plates 3 x 10    5/78/46:  3D hip excursion (20x weight shifting, rotation, 10x squat front of mat (cueing for equal weight bearing), extension)  Retro walk 4 plates x 5RT  Sidestep 4Pl 5RT each  Reviewed goals   2MWT  MMT  ROM  5STS  FOTO  02/11/22 HR 1x 20 TR 1x 20 Step up 6 inch 1x 20 bilateral with bilateral UE support Lateral step up 6 inch x 10 each Standing hip abduction 3lb 3 x 10 each Standing hip extension 3lb 3 x 10 each  LAQ 3lb 3 x 10 each   Retro walk 4 plates x 10 STS 2x 10  Hamstring curls 3lb 3 x 10 each   Tandem gait 4 RT    02/09/22 HR 1x 20 TR 1x 20 Step up 6 inch 1x 20 bilateral with bilateral UE support Lateral step up 6 inch 2 x 10  Standing hip abduction RTB at knees 2x 10 bilateral  Standing hip extension RTB at ankles 2x 10 bilateral  STS 2x 10  Leg press 3 plates 2x 15 bilateral with emphasis on LLE Hamstring curls 2# 1x 20 bilateral    02/04/22 Heel raise x20 Toe raise x20  Step up 4 inch 2 x 10 HHA x 2  Standing hip abduction 10 x 3" with UE support Standing hip extension 10 x 3" with UE support Partial lunge to 6 inch step 2 x 10 each Retro walk 3 plates x15    Clinic ambulation 1.5 RT in clinic with walking stick (cues for heel strike on LT)   Nustep (seat 10) lv 3 6 min   02/02/22: Standing: 200 ft with walking  stick, cueing for proper hand to support Lt LE and sequence X 2 sets initially and at EOS Broken down gait in // bars to improve weight shifting, staggered weight shift, gait mechanics, hip extension with uni UE support then no UE  3D hip excursion (weight shifting, rotation, STS controlled with elevated height, extension) pain free range Heel raise incline slope Toe raise decline slope ABD with UE support 10x 3" Hip extension with UE support ab set 10x3" Knee flexion 10x 3" Lunge onto 6in step height 10x 3" with 1 HHA  01/21/22 Hamstring stretch Sktc stretch Single leg bridge Sidelying hip abd Seated figure 4 stretch Seated forward rolls Heel raises  Step stance weightshifts with UE support  Broken down gait in // bars focusing on hip extension, weightshift and heel contact b/l UE uspport-> uni Ue support-> no UE support-> out of bars with CG-> with walking stick x10 mins    PATIENT EDUCATION:  Education details: 02/09/22 HEP;  Patient educated on exam findings, POC, scope of PT Person educated: Patient Education method: Explanation, Demonstration, and Handouts Education comprehension: verbalized understanding,  returned demonstration, verbal cues required, and tactile cues required    HOME EXERCISE PROGRAM: Access Code: [email or web address] URL: [email or web address] Date: 01/21/2022 Prepared by: Leota Jacobsen  Exercises - Supine Hamstring Stretch with Strap  - 2 x daily - 7 x weekly - 1 sets - 5 reps - 10sec hold - Supine Single Knee to Chest Stretch  - 2 x daily - 7 x weekly - 1 sets - 5 reps - 10 sec hold - Figure 4 Bridge  - 2 x daily - 7 x weekly - 2 sets - 10 reps - Sidelying Hip Abduction  - 2 x daily - 7 x weekly - 2 sets - 10 reps - Seated Piriformis Stretch with Trunk Bend  - 2 x daily - 7 x weekly - 1 sets - 5 reps - 10 sec hold - Seated Flexion Stretch with Swiss Ball  - 2 x daily - 7 x weekly - 1 sets - 15 reps - 5sec hold - Heel Raises with Counter Support   - 2 x daily - 7 x weekly - 3 sets - 10 reps - Staggered Stance Step Throughs  - 2 x daily - 7 x weekly - 3 sets - 10 reps  02/02/22: begin walking program focus on good mechanics 3D hip excursion (weight shift, rotation, STS and extend)  ASSESSMENT:  CLINICAL IMPRESSION: Patient tolerated session well today. Session limited per patient had MD appointment at 11am today. Able to progress strengthening exercise with added machine resistance. Patient did note increased lumbar discomfort with leg press at 5 plates so activity discontinued at that weight. Patient continues to be limited by functional weakness and decreased balance/ coordination. Patient will continue to benefit from skilled therapy services to reduce remaining deficits and improve functional ability.    OBJECTIVE IMPAIRMENTS Abnormal gait, decreased activity tolerance, decreased balance, decreased coordination, decreased endurance, decreased mobility, difficulty walking, decreased ROM, decreased strength, impaired flexibility, impaired sensation, impaired tone, and pain.   ACTIVITY LIMITATIONS carrying, lifting, bending, sitting, standing, squatting, sleeping, stairs, transfers, bed mobility, bathing, dressing, and locomotion level  PARTICIPATION LIMITATIONS: cleaning, laundry, driving, shopping, community activity, occupation, and yard work  PERSONAL FACTORS Time since onset of injury/illness/exacerbation and 3+ comorbidities:  Allergies, Anxiety or Panic Disorders, Arthritis, Back pain, BMI over 30, Depression, Gastrointestinal Disease, Hearing Impairment, High Blood Pressure, Kidney, Bladder, Prostate or Urination Problems,Prior Surgery, Sleep dysfunction  are also affecting patient's functional outcome.   REHAB POTENTIAL: Fair time since injury, second course of therapy, pain levels  CLINICAL DECISION MAKING: Stable/uncomplicated  EVALUATION COMPLEXITY: Moderate   GOALS: Goals reviewed with patient? No  SHORT TERM GOALS:  Target date: 02/09/2022  Patient will be independent with HEP in order to improve functional outcomes. Baseline: 02/17/22:  Reports compliance with HEP 5x/ week Goal status: MET  2.  Patient will report at least 25% improvement in symptoms for improved quality of life. Baseline: 02/17/22:  35-40% improvements Goal status: MET  3.  Patient will improve FTSS by 10 seconds to improve efficiency of tranfers. Baseline:  02/17/22: 5 STS 19.06" standard height no HHA required; eval was: 5 times sit to stand: 34.96 sec Goal status: MET   LONG TERM GOALS: Target date: 03/09/2022   Patient will report at least 50% improvement in symptoms for improved quality of life. Baseline: 02/17/22:  35-40% improvements Goal status: IN PROGRESS  2.  Patient will improve FOTO score by at least 10 points in order to indicate improved tolerance to activity. Baseline: Eval: 35%, 02/17/22:  42.48% Goal status: IN PROGRESS   4.  Patient will be able to ambulate at least 300' feet in 2MWT in order to demonstrate improved tolerance to activity. Baseline:02/17/22: 2MWT 376f with cane; eval: 2 minute walk test: 205' Goal status: MET   PLAN: PT FREQUENCY: 2x/week  PT DURATION: other: 7 weeks  PLANNED INTERVENTIONS: Therapeutic exercises, Therapeutic activity, Neuromuscular re-education, Balance training, Gait training, Patient/Family education, Joint manipulation, Joint mobilization, Stair training, Orthotic/Fit training, DME instructions, Aquatic Therapy, Dry Needling, Electrical stimulation, Spinal manipulation, Spinal mobilization, Cryotherapy, Moist heat, Compression bandaging, scar mobilization, Splintting, Taping, Traction, Ultrasound, Ionotophoresis 429mml Dexamethasone, and Manual therapy  PLAN FOR NEXT SESSION: LE stretching, heel raises, split stance weightshift, hamstring strength, hip abd strength  11:08 AM, 02/23/22 CaJosue HectorT DPT  Physical [facility]rap[facility]([phone]

## 2022-02-25 ENCOUNTER — Encounter (HOSPITAL_COMMUNITY): Payer: Self-pay | Admitting: Physical Therapy

## 2022-02-25 ENCOUNTER — Ambulatory Visit (HOSPITAL_COMMUNITY): Payer: No Typology Code available for payment source | Admitting: Physical Therapy

## 2022-02-25 DIAGNOSIS — Z981 Arthrodesis status: Secondary | ICD-10-CM | POA: Diagnosis not present

## 2022-02-25 DIAGNOSIS — R262 Difficulty in walking, not elsewhere classified: Secondary | ICD-10-CM

## 2022-02-25 DIAGNOSIS — G8929 Other chronic pain: Secondary | ICD-10-CM

## 2022-02-25 DIAGNOSIS — M6281 Muscle weakness (generalized): Secondary | ICD-10-CM

## 2022-02-25 NOTE — Therapy (Signed)
OUTPATIENT PHYSICAL THERAPY THORACOLUMBAR TREATMENT   Patient Name: John Jordan MRN: [id number] DOB:Aug 14, 1960, 61 y.o., male Today's Date: 02/25/2022   PT End of Session - 02/25/22 0946     Visit Number 9    Number of Visits 15    Date for PT Re-Evaluation 03/09/22    [facility] ([facility] visits)    Authorization - Visit Number 9    Authorization - Number of Visits 15    Progress Note Due on Visit 15    PT Start Time [phone]    PT Stop Time 1026    PT Time Calculation (min) 38 min    Activity Tolerance Patient tolerated treatment well    Behavior During Therapy WFL for tasks assessed/performed               Past Medical History:  Diagnosis Date   Allergy    seasonal allergies   Anxiety    Arthritis    generalized   Depression    Diverticulitis    GERD (gastroesophageal reflux disease)    on meds   Hypertension    on meds   Past Surgical History:  Procedure Laterality Date   BACK SURGERY     [facility]   [facility]   HERNIA REPAIR Bilateral    bilateral inguinal hernia repair   WISDOM TOOTH EXTRACTION     There are no problems to display for this patient.   PCP:Dr. Nevada Crane in [place] PROVIDER: Rennis Harding Next apt: 02/19/22  REFERRING DIAG: PT eval and tx M43.6 Fusion of spine, lumbar region  per Dr. Rennis Harding   Rationale for Evaluation and Treatment Rehabilitation  THERAPY DIAG:  Muscle weakness (generalized)  Difficulty in walking, not elsewhere classified  Chronic bilateral low back pain, unspecified whether sciatica present  ONSET DATE: 06/08/21  SUBJECTIVE:                                                                                                                                                                                           SUBJECTIVE STATEMENT: MD visit went well, bloodwork came back good. No new issues. Compliant with HEP without issues.    PERTINENT HISTORY:  Patient reports that his original L5-S1 laminectomy and fusion surgery was on 06/08/21, but he "had to be reopened" on 06/10/21 in order to fix an issue with a nerve block overdose. Following the second surgery his RLE recovered to what he says is around "90%" but his LLE is lagging behind considerably. When questioned regarding bowel bladder dysfunction, he reported having to self apply a  catheter prior to surgery. He does also report what sounds like saddle anesthesia as well, but that the physicians are aware of these symptoms. He states that the physician cleared him to drive and increased his lifting restriction to 25lbs, but he does still have the no bending, lifting, or twisting precautions. He was experiencing spasms in his low back yesterday to the point he almost went to the ER.   PAIN:  Are you having pain? Yes: NPRS scale: 6/10 Pain location: left side predominant in legs, hips and back  Pain description: tingling, numbness, sharp pain, ache, soreness  Aggravating factors: certain movements regardless of sitting or standing  Relieving factors: medications help, vibration gun, heating pad   PRECAUTIONS: Other: lifting heavy weights  WEIGHT BEARING RESTRICTIONS No  FALLS:  Has patient fallen in last 6 months? Yes. Number of falls 3-4, last one 1 month ago while walking  LIVING ENVIRONMENT: Lives with: lives with their spouse Lives in: House/apartment Stairs: Yes: External: ramp currently installed steps; on right going up Has following equipment at home: Single point cane, Walker - 2 wheeled, and Shower bench  OCCUPATION: disabled   PLOF:  pt was fighting through pain for several years prior to surgery on 06/08/21 . Last pt was able to walk correctly was right before surgery on 06/08/21.   PATIENT GOALS improve walking, try to be less dependent on wife such as showering, dressing, assistance with walking.    OBJECTIVE:   DIAGNOSTIC FINDINGS:  L5-S1:  Greatest level of disc narrowing and disc desiccation with left eccentric endplate marrow signal changes and left eccentric disc bulge with osteophytic spurring. Similar small inferiorly dissecting central disc protrusion. Similar mild left foraminal stenosis without significant canal stenosis. Similar left far lateral/extraforaminal disc/osteophyte.   IMPRESSION: No substantial change in disc and facet degenerative change that is most notable at L5-S1 and detailed above.  PATIENT SURVEYS:  FOTO 35  02/17/22: 42.48%functional  SCREENING FOR RED FLAGS: Bowel or bladder incontinence: Yes: pt reports he has to self cath and has numbness into groin region Spinal tumors: No Cauda equina syndrome: No Compression fracture: No Abdominal aneurysm: No  COGNITION:  Overall cognitive status: Within functional limits for tasks assessed     SENSATION: Light touch: L2 and down on left side there is numbness, intact but altered from right LE   PALPATION: TTP left lower lumber spine along transverse processes  LUMBAR ROM:   Active  A/PROM  eval AROM 02/17/22  Flexion Initial starting point, bisecting line of PSIS and 15cm above  6 cm 15cm from floor  Extension Initial starting point, bisecting line of PSIS and 15cm above  1cm 8 degrees  Right lateral flexion 58cm to ground 50cm to ground  Left lateral flexion 62 cm to ground 55cm to ground  Right rotation    Left rotation     (Blank rows = not tested)    LOWER EXTREMITY MMT:    MMT Right eval Left eval Right 02/17/22 Left  02/17/22  Hip flexion  3+ (supine) 4+ supine 4+/5  Hip extension 5 4 5/5 sidelying 4/5 sidelying  Hip abduction 4 3 4+ 4+  Hip adduction 4 3+ 4/5 4+/5  Hip internal rotation      Hip external rotation      Knee flexion 5 4 5  4+  Knee extension 5 4 5  4+  Ankle dorsiflexion 5 4 5  4+  Ankle plantarflexion      Ankle inversion 5 4 5/5 4/5  Ankle  eversion 5 5     (Blank rows = not tested)  LUMBAR SPECIAL  TESTS:  Slump test: Negative  FUNCTIONAL TESTS:  5 times sit to stand: 34.96 sec 2 minute walk test: 205'   Tranfers  Requires b/L UE support to rise. Can not stand without UE support GAIT: Distance walked: 205' Assistive device utilized:  walking stick Level of assistance: Modified independence Comments: decreased left knee flexion extension, shortened step, decreased cadence, decreased heel contact, decreased hip extension    TODAY'S TREATMENT  02/25/22 Heel/ toe raise x 20 each  6 inch step up x15 each HHA x 2  Power up on 6 inch step  HS curl machine 6 plates 3 x 10   Weighted walkouts 4 plates retro/ side/ side 15 x each  Leg press 3/4/4 plates x 10 each Nu step lv 4 5 min warmup   02/23/22 Nu step lv 4 4 min warmup  Weighted walkouts 4 plates retro/ side/ side 10 x each  Leg press 3/4/5 plates x 10 each (increased low back pain with 5 plates) HS curl machine 6 plates 3 x 10    6/57/84:  3D hip excursion (20x weight shifting, rotation, 10x squat front of mat (cueing for equal weight bearing), extension)  Retro walk 4 plates x 5RT  Sidestep 4Pl 5RT each  Reviewed goals   2MWT  MMT  ROM  5STS  FOTO    PATIENT EDUCATION:  Education details: 02/09/22 HEP;  Patient educated on exam findings, POC, scope of PT Person educated: Patient Education method: Explanation, Demonstration, and Handouts Education comprehension: verbalized understanding, returned demonstration, verbal cues required, and tactile cues required    HOME EXERCISE PROGRAM: Access Code: [email or web address] URL: [email or web address] Date: 01/21/2022 Prepared by: Leota Jacobsen  Exercises - Supine Hamstring Stretch with Strap  - 2 x daily - 7 x weekly - 1 sets - 5 reps - 10sec hold - Supine Single Knee to Chest Stretch  - 2 x daily - 7 x weekly - 1 sets - 5 reps - 10 sec hold - Figure 4 Bridge  - 2 x daily - 7 x weekly - 2 sets - 10 reps - Sidelying Hip Abduction  - 2 x daily - 7 x weekly -  2 sets - 10 reps - Seated Piriformis Stretch with Trunk Bend  - 2 x daily - 7 x weekly - 1 sets - 5 reps - 10 sec hold - Seated Flexion Stretch with Swiss Ball  - 2 x daily - 7 x weekly - 1 sets - 15 reps - 5sec hold - Heel Raises with Counter Support  - 2 x daily - 7 x weekly - 3 sets - 10 reps - Staggered Stance Step Throughs  - 2 x daily - 7 x weekly - 3 sets - 10 reps  02/02/22: begin walking program focus on good mechanics 3D hip excursion (weight shift, rotation, STS and extend)  ASSESSMENT:  CLINICAL IMPRESSION: Patient tolerated session well today. Noting mild soreness and muscle fatigue at end of session. Added power ups on 6 inch box for balance/ coordination progression. Patient educated on purpose and function of added activity. Patient will continue to benefit from skilled therapy services to reduce remaining deficits and improve functional ability.     OBJECTIVE IMPAIRMENTS Abnormal gait, decreased activity tolerance, decreased balance, decreased coordination, decreased endurance, decreased mobility, difficulty walking, decreased ROM, decreased strength, impaired flexibility, impaired sensation, impaired tone, and pain.   ACTIVITY LIMITATIONS carrying, lifting, bending,  sitting, standing, squatting, sleeping, stairs, transfers, bed mobility, bathing, dressing, and locomotion level  PARTICIPATION LIMITATIONS: cleaning, laundry, driving, shopping, community activity, occupation, and yard work  PERSONAL FACTORS Time since onset of injury/illness/exacerbation and 3+ comorbidities:  Allergies, Anxiety or Panic Disorders, Arthritis, Back pain, BMI over 30, Depression, Gastrointestinal Disease, Hearing Impairment, High Blood Pressure, Kidney, Bladder, Prostate or Urination Problems,Prior Surgery, Sleep dysfunction  are also affecting patient's functional outcome.   REHAB POTENTIAL: Fair time since injury, second course of therapy, pain levels  CLINICAL DECISION MAKING:  Stable/uncomplicated  EVALUATION COMPLEXITY: Moderate   GOALS: Goals reviewed with patient? No  SHORT TERM GOALS: Target date: 02/09/2022  Patient will be independent with HEP in order to improve functional outcomes. Baseline: 02/17/22:  Reports compliance with HEP 5x/ week Goal status: MET  2.  Patient will report at least 25% improvement in symptoms for improved quality of life. Baseline: 02/17/22:  35-40% improvements Goal status: MET  3.  Patient will improve FTSS by 10 seconds to improve efficiency of tranfers. Baseline:  02/17/22: 5 STS 19.06" standard height no HHA required; eval was: 5 times sit to stand: 34.96 sec Goal status: MET   LONG TERM GOALS: Target date: 03/09/2022   Patient will report at least 50% improvement in symptoms for improved quality of life. Baseline: 02/17/22:  35-40% improvements Goal status: IN PROGRESS  2.  Patient will improve FOTO score by at least 10 points in order to indicate improved tolerance to activity. Baseline: Eval: 35%, 02/17/22: 42.48% Goal status: IN PROGRESS   4.  Patient will be able to ambulate at least 300' feet in 2MWT in order to demonstrate improved tolerance to activity. Baseline:02/17/22: 2MWT 341f with cane; eval: 2 minute walk test: 205' Goal status: MET   PLAN: PT FREQUENCY: 2x/week  PT DURATION: other: 7 weeks  PLANNED INTERVENTIONS: Therapeutic exercises, Therapeutic activity, Neuromuscular re-education, Balance training, Gait training, Patient/Family education, Joint manipulation, Joint mobilization, Stair training, Orthotic/Fit training, DME instructions, Aquatic Therapy, Dry Needling, Electrical stimulation, Spinal manipulation, Spinal mobilization, Cryotherapy, Moist heat, Compression bandaging, scar mobilization, Splintting, Taping, Traction, Ultrasound, Ionotophoresis 446mml Dexamethasone, and Manual therapy  PLAN FOR NEXT SESSION: LE stretching, heel raises, split stance weightshift, hamstring strength, hip abd  strength  10:28 AM, 02/25/22 CaJosue HectorT DPT  Physical Therapist with [facility]([phone]

## 2022-04-29 ENCOUNTER — Other Ambulatory Visit: Payer: Self-pay

## 2022-04-29 ENCOUNTER — Encounter (HOSPITAL_COMMUNITY): Payer: Self-pay | Admitting: *Deleted

## 2022-04-29 ENCOUNTER — Emergency Department (HOSPITAL_COMMUNITY)
Admission: EM | Admit: 2022-04-29 | Discharge: 2022-04-29 | Disposition: A | Discharge status: Home / Self Care | Payer: No Typology Code available for payment source | Attending: Emergency Medicine | Admitting: Emergency Medicine

## 2022-04-29 ENCOUNTER — Emergency Department (HOSPITAL_COMMUNITY): Payer: No Typology Code available for payment source

## 2022-04-29 DIAGNOSIS — R112 Nausea with vomiting, unspecified: Secondary | ICD-10-CM | POA: Diagnosis not present

## 2022-04-29 DIAGNOSIS — T22222D Burn of second degree of left elbow, subsequent encounter: Secondary | ICD-10-CM | POA: Diagnosis present

## 2022-04-29 DIAGNOSIS — R059 Cough, unspecified: Secondary | ICD-10-CM | POA: Diagnosis not present

## 2022-04-29 DIAGNOSIS — L03114 Cellulitis of left upper limb: Secondary | ICD-10-CM | POA: Insufficient documentation

## 2022-04-29 DIAGNOSIS — X19XXXD Contact with other heat and hot substances, subsequent encounter: Secondary | ICD-10-CM | POA: Insufficient documentation

## 2022-04-29 DIAGNOSIS — D72829 Elevated white blood cell count, unspecified: Secondary | ICD-10-CM | POA: Insufficient documentation

## 2022-04-29 DIAGNOSIS — T300 Burn of unspecified body region, unspecified degree: Secondary | ICD-10-CM

## 2022-04-29 DIAGNOSIS — R5381 Other malaise: Secondary | ICD-10-CM | POA: Diagnosis not present

## 2022-04-29 LAB — COMPREHENSIVE METABOLIC PANEL
ALT: 16 U/L (ref 0–44)
AST: 21 U/L (ref 15–41)
Albumin: 4.2 g/dL (ref 3.5–5.0)
Alkaline Phosphatase: 56 U/L (ref 38–126)
Anion gap: 11 (ref 5–15)
BUN: 28 mg/dL — ABNORMAL HIGH (ref 8–23)
CO2: 22 mmol/L (ref 22–32)
Calcium: 9.6 mg/dL (ref 8.9–10.3)
Chloride: 105 mmol/L (ref 98–111)
Creatinine, Ser: 1.42 mg/dL — ABNORMAL HIGH (ref 0.61–1.24)
GFR, Estimated: 56 mL/min — ABNORMAL LOW (ref >60)
Glucose, Bld: 156 mg/dL — ABNORMAL HIGH (ref 70–99)
Potassium: 3.7 mmol/L (ref 3.5–5.1)
Sodium: 138 mmol/L (ref 135–145)
Total Bilirubin: 0.9 mg/dL (ref 0.3–1.2)
Total Protein: 8 g/dL (ref 6.5–8.1)

## 2022-04-29 LAB — URINALYSIS, ROUTINE W REFLEX MICROSCOPIC
Bacteria, UA: NONE SEEN
Bilirubin Urine: NEGATIVE
Glucose, UA: NEGATIVE mg/dL
Hgb urine dipstick: NEGATIVE
Ketones, ur: NEGATIVE mg/dL
Leukocytes,Ua: NEGATIVE
Nitrite: NEGATIVE
Protein, ur: 30 mg/dL — AB
Specific Gravity, Urine: 1.021 (ref 1.005–1.030)
pH: 8 (ref 5.0–8.0)

## 2022-04-29 LAB — CBC WITH DIFFERENTIAL/PLATELET
Abs Immature Granulocytes: 0.05 10*3/uL (ref 0.00–0.07)
Basophils Absolute: 0 10*3/uL (ref 0.0–0.1)
Basophils Relative: 0 %
Eosinophils Absolute: 0 10*3/uL (ref 0.0–0.5)
Eosinophils Relative: 0 %
HCT: 35.9 % — ABNORMAL LOW (ref 39.0–52.0)
Hemoglobin: 12.5 g/dL — ABNORMAL LOW (ref 13.0–17.0)
Immature Granulocytes: 0 %
Lymphocytes Relative: 6 %
Lymphs Abs: 0.8 10*3/uL (ref 0.7–4.0)
MCH: 30.9 pg (ref 26.0–34.0)
MCHC: 34.8 g/dL (ref 30.0–36.0)
MCV: 88.9 fL (ref 80.0–100.0)
Monocytes Absolute: 0.4 10*3/uL (ref 0.1–1.0)
Monocytes Relative: 3 %
Neutro Abs: 11.6 10*3/uL — ABNORMAL HIGH (ref 1.7–7.7)
Neutrophils Relative %: 91 %
Platelets: 297 10*3/uL (ref 150–400)
RBC: 4.04 MIL/uL — ABNORMAL LOW (ref 4.22–5.81)
RDW: 13.2 % (ref 11.5–15.5)
WBC: 12.8 10*3/uL — ABNORMAL HIGH (ref 4.0–10.5)
nRBC: 0 % (ref 0.0–0.2)

## 2022-04-29 LAB — LIPASE, BLOOD: Lipase: 28 U/L (ref 11–51)

## 2022-04-29 MED ORDER — PROMETHAZINE HCL 12.5 MG PO TABS
25.0000 mg | ORAL_TABLET | Freq: Once | ORAL | Status: AC
  Administered 2022-04-29: 25 mg via ORAL
  Filled 2022-04-29: qty 2

## 2022-04-29 MED ORDER — PROMETHAZINE HCL 25 MG PO TABS: 25.0000 mg | ORAL_TABLET | Freq: Four times a day (QID) | ORAL | 0 refills | Status: DC | PRN

## 2022-04-29 MED ORDER — CEPHALEXIN 500 MG PO CAPS
500.0000 mg | ORAL_CAPSULE | Freq: Four times a day (QID) | ORAL | 0 refills | Status: DC
Start: 2022-04-29 — End: 2024-01-17

## 2022-04-29 MED ORDER — OXYCODONE HCL 5 MG PO TABS
15.0000 mg | ORAL_TABLET | Freq: Once | ORAL | Status: AC
  Administered 2022-04-29: 15 mg via ORAL
  Filled 2022-04-29: qty 3

## 2022-04-29 MED ORDER — SODIUM CHLORIDE 0.9 % IV BOLUS
1000.0000 mL | Freq: Once | INTRAVENOUS | Status: AC
  Administered 2022-04-29: 1000 mL via INTRAVENOUS

## 2022-04-29 MED ORDER — VANCOMYCIN HCL IN DEXTROSE 1-5 GM/200ML-% IV SOLN
1000.0000 mg | Freq: Once | INTRAVENOUS | Status: AC
  Administered 2022-04-29: 1000 mg via INTRAVENOUS
  Filled 2022-04-29: qty 200

## 2022-04-29 NOTE — ED Notes (Signed)
Pt encouraged to drink his 240 ml of water.

## 2022-04-29 NOTE — ED Triage Notes (Signed)
Pt c/o burn to left elbow x one week; pt states he fell asleep with heating pad on  Elbow has scabbed area with redness and swelling

## 2022-04-29 NOTE — ED Notes (Signed)
Area cleaned and new dressing applied to left elbow burn after culture obtained.

## 2022-04-29 NOTE — ED Provider Notes (Signed)
[facility] EMERGENCY DEPARTMENT Provider Note   CSN: [id number] Arrival date & time: 04/29/22  1103     History Chief Complaint  Patient presents with   Burn    John Jordan is a 61 y.o. male patient who presents to the emergency department with left elbow pain, general malaise, and vomiting.  Patient was diagnosed with COVID roughly 2 weeks ago but has not felt good since the diagnosis.  He has been having intermittent nausea and vomiting primarily when he eats or drinks.  He does have Zofran at home which he has been taking with little relief.  Patient also is having worsening chronic back pain as he has been unable to take his 15 mg of oxycodone secondary to vomiting.  He denies any abdominal pain, diarrhea, chest pain, shortness of breath.  He does endorse cough and low-grade fever.  In addition, patient fell asleep with a heating pad on the left elbow and sustained a burn 1 week ago.  The arm is now swollen and increasingly painful.   Burn      Home Medications Prior to Admission medications   Medication Sig Start Date End Date Taking? Authorizing Provider  baclofen (LIORESAL) 20 MG tablet Take 10-20 mg by mouth in the morning, at noon, and at bedtime. 20mg  morning and evening and 10mg  at noon 08/28/21  Yes [provider]  cephALEXin (KEFLEX) 500 MG capsule Take 1 capsule (500 mg total) by mouth 4 (four) times daily. 04/29/22  Yes Raul Del, Hadeel Hillebrand M, PA-C  cholecalciferol (VITAMIN D3) 25 MCG (1000 UNIT) tablet Take 1,000 Units by mouth daily.   Yes [provider]  Cyanocobalamin (VITAMIN B-12 PO) Take 1 tablet by mouth daily.   Yes [provider]  hydrochlorothiazide (MICROZIDE) 12.5 MG capsule Take 12.5 mg by mouth daily. 08/28/21  Yes [provider]  lisinopril (PRINIVIL,ZESTRIL) 10 MG tablet Take 10 mg by mouth daily.   Yes [provider]  oxyCODONE (ROXICODONE) 15 MG immediate release tablet Take 15 mg by mouth every 6 (six)  hours. 08/04/21  Yes [provider]  pantoprazole (PROTONIX) 40 MG tablet Take 40 mg by mouth 2 (two) times daily. 12/31/19  Yes [provider]  pregabalin (LYRICA) 75 MG capsule Take 150 mg by mouth in the morning and at bedtime. 08/17/21  Yes [provider]  promethazine (PHENERGAN) 25 MG tablet Take 1 tablet (25 mg total) by mouth every 6 (six) hours as needed for nausea or vomiting. 04/29/22  Yes Kerina Simoneau M, PA-C  saccharomyces boulardii (FLORASTOR) 250 MG capsule Take 250 mg by mouth 2 (two) times daily.   Yes [provider]  ciprofloxacin (CIPRO) 500 MG tablet One po bid Patient not taking: Reported on 09/04/2021 05/30/20   Milton Ferguson, MD  HYDROcodone-acetaminophen (NORCO/VICODIN) 5-325 MG tablet Take 1 tablet by mouth every 6 (six) hours as needed. Patient not taking: Reported on 09/04/2021 05/30/20   Milton Ferguson, MD  hydrOXYzine (VISTARIL) 25 MG capsule Take 25 mg by mouth daily as needed for anxiety. Patient not taking: Reported on 04/29/2022 06/01/21   [provider]  metroNIDAZOLE (FLAGYL) 500 MG tablet One po bid Patient not taking: Reported on 09/04/2021 05/30/20   Milton Ferguson, MD  ondansetron (ZOFRAN ODT) 4 MG disintegrating tablet 4mg  ODT q4 hours prn nausea/vomit Patient not taking: Reported on 04/29/2022 05/30/20   Milton Ferguson, MD      Allergies    Nsaids and Sulfa antibiotics    Review of Systems  Review of Systems  All other systems reviewed and are negative.   Physical Exam Updated Vital Signs BP 138/80   Pulse 83   Temp 98.4 F (36.9 C) (Oral)   Resp 10   Ht 5\' 11"  (1.803 m)   Wt 99.8 kg   SpO2 98%   BMI 30.69 kg/m  Physical Exam Vitals and nursing note reviewed.  Constitutional:      General: He is not in acute distress.    Appearance: Normal appearance.  HENT:     Head: Normocephalic and atraumatic.  Eyes:     General:        Right eye: No discharge.        Left eye: No discharge.   Cardiovascular:     Comments: Regular rate and rhythm.  S1/S2 are distinct without any evidence of murmur, rubs, or gallops.  Radial pulses are 2+ bilaterally.  Dorsalis pedis pulses are 2+ bilaterally.  No evidence of pedal edema. Pulmonary:     Comments: Clear to auscultation bilaterally.  Normal effort.  No respiratory distress.  No evidence of wheezes, rales, or rhonchi heard throughout. Abdominal:     General: Abdomen is flat. Bowel sounds are normal. There is no distension.     Tenderness: There is no abdominal tenderness. There is no guarding or rebound.  Musculoskeletal:        General: Normal range of motion.     Cervical back: Neck supple.  Skin:    General: Skin is warm and dry.     Findings: No rash.     Comments: There appears to be a well-healing second-degree burn to the left elbow.  Surrounding area has slight erythema, is moderately swollen, and warm to palpation.  Neurological:     General: No focal deficit present.     Mental Status: He is alert.  Psychiatric:        Mood and Affect: Mood normal.        Behavior: Behavior normal.     ED Results / Procedures / Treatments   Labs (all labs ordered are listed, but only abnormal results are displayed) Labs Reviewed  CBC WITH DIFFERENTIAL/PLATELET - Abnormal; Notable for the following components:      Result Value   WBC 12.8 (*)    RBC 4.04 (*)    Hemoglobin 12.5 (*)    HCT 35.9 (*)    Neutro Abs 11.6 (*)    All other components within normal limits  COMPREHENSIVE METABOLIC PANEL - Abnormal; Notable for the following components:   Glucose, Bld 156 (*)    BUN 28 (*)    Creatinine, Ser 1.42 (*)    GFR, Estimated 56 (*)    All other components within normal limits  URINALYSIS, ROUTINE W REFLEX MICROSCOPIC - Abnormal; Notable for the following components:   Protein, ur 30 (*)    All other components within normal limits  AEROBIC CULTURE W GRAM STAIN (SUPERFICIAL SPECIMEN)  LIPASE, BLOOD     EKG None  Radiology DG Elbow Complete Left  Result Date: 04/29/2022 CLINICAL DATA:  Burned elbow 1 week ago by leaving the hot pad on too long. Now with pain, redness and swelling. EXAM: LEFT ELBOW - COMPLETE 3+ VIEW COMPARISON:  None Available. FINDINGS: There is mild scratch set diffuse soft tissue edema/swelling is noted overlying the left elbow. This is most notable along the posterior aspect of the elbow. There is no underlying fracture or dislocation. IMPRESSION: 1. Soft tissue edema/swelling. Most notable along  the posterior aspect of the elbow. Correlate for any clinical signs/symptoms of cellulitis or olecranon bursitis. 2. No acute bone abnormality. Electronically Signed   By: Signa Kell M.D.   On: 04/29/2022 12:39   DG Chest 2 View  Result Date: 04/29/2022 CLINICAL DATA:  Cough and fever EXAM: CHEST - 2 VIEW COMPARISON:  09/04/2021 FINDINGS: The heart size and mediastinal contours are within normal limits. Both lungs are clear. The visualized skeletal structures are unremarkable. IMPRESSION: No active cardiopulmonary disease. Electronically Signed   By: Marlan Palau M.D.   On: 04/29/2022 12:37    Procedures Procedures    Medications Ordered in ED Medications  sodium chloride 0.9 % bolus 1,000 mL (0 mLs Intravenous Stopped 04/29/22 1428)  promethazine (PHENERGAN) tablet 25 mg (25 mg Oral Given 04/29/22 1233)  oxyCODONE (Oxy IR/ROXICODONE) immediate release tablet 15 mg (15 mg Oral Given 04/29/22 1232)  vancomycin (VANCOCIN) IVPB 1000 mg/200 mL premix (0 mg Intravenous Stopped 04/29/22 1540)    ED Course/ Medical Decision Making/ A&P Clinical Course as of 04/29/22 1619  Thu Apr 29, 2022  1534 CBC with Differential(!) There is evidence of leukocytosis. [CF]  1534 Comprehensive metabolic panel(!) Normal apart from slightly elevated creatinine which is improved from previous. [CF]  1534 Lipase, blood Normal. [CF]  1611 Urinalysis, Routine w reflex microscopic  Urine, Clean Catch(!) Urinalysis is negative. [CF]  1611 Aerobic Culture w Gram Stain (superficial specimen) Wound culture obtained and pending. [CF]  1611 On reevaluation, patient tolerated fluid challenge.  Vancomycin has completely ran to the IV.  Patient willing to go home.  I think this is reasonable. [CF]    Clinical Course User Index [CF] Teressa Lower, PA-C                           Medical Decision Making Neyland Pettengill is a 61 y.o. male patient who presents to the emergency department today for further evaluation of general malaise and a burn to the left elbow.  The left elbow does look somewhat concerning for developing cellulitis.  Patient does appear clinically dry.  I will plan to give him some fluids, get an x-ray of the left elbow to look for deep-seated infection, chest x-ray, and basic labs.  We will also give him some Phenergan and try to give him his oxycodone to see if this clinical picture is mixed with possible withdrawal type symptoms.  Patient's vital signs are completely normal.  He is in no acute distress at this time.  Patient feeling enough to go home.  He is tolerated p.o. fluids.  I will discharge him with Phenergan and Keflex.  Patient amenable to this plan.  Strict return precaution were discussed.  He is safe for discharge.   Amount and/or Complexity of Data Reviewed Labs: ordered. Decision-making details documented in ED Course. Radiology: ordered.  Risk Prescription drug management.    Final Clinical Impression(s) / ED Diagnoses Final diagnoses:  Burn  Cellulitis of left upper extremity    Rx / DC Orders ED Discharge Orders          Ordered    cephALEXin (KEFLEX) 500 MG capsule  4 times daily        04/29/22 1424    promethazine (PHENERGAN) 25 MG tablet  Every 6 hours PRN        04/29/22 1612              Honor Loh [place], [place] 04/29/22  [id number]    Bethann Berkshire, MD 05/02/22 [phone]

## 2022-04-29 NOTE — Discharge Instructions (Addendum)
I am going to give you antibiotics to take as I do feel that the burn is getting infected.  The good news is that your vital signs were normal here.  Imaging of your chest and elbow were normal.  Please take Phenergan which is antinausea medication as needed and as prescribed.  Follow-up with your primary care doctor in 1 week.  Please return to the emergency department for any worsening symptoms.

## 2022-04-30 ENCOUNTER — Emergency Department (HOSPITAL_COMMUNITY): Payer: No Typology Code available for payment source

## 2022-04-30 ENCOUNTER — Other Ambulatory Visit: Payer: Self-pay

## 2022-04-30 ENCOUNTER — Inpatient Hospital Stay (HOSPITAL_COMMUNITY)
Admission: EM | Admit: 2022-04-30 | Discharge: 2022-05-03 | DRG: 392 | Disposition: A | Discharge status: Home / Self Care | Payer: No Typology Code available for payment source | Attending: Family Medicine | Admitting: Family Medicine

## 2022-04-30 ENCOUNTER — Encounter (HOSPITAL_COMMUNITY): Payer: Self-pay | Admitting: *Deleted

## 2022-04-30 DIAGNOSIS — I10 Essential (primary) hypertension: Secondary | ICD-10-CM

## 2022-04-30 DIAGNOSIS — K5790 Diverticulosis of intestine, part unspecified, without perforation or abscess without bleeding: Secondary | ICD-10-CM | POA: Diagnosis not present

## 2022-04-30 DIAGNOSIS — N1831 Chronic kidney disease, stage 3a: Secondary | ICD-10-CM | POA: Diagnosis present

## 2022-04-30 DIAGNOSIS — L03114 Cellulitis of left upper limb: Secondary | ICD-10-CM | POA: Diagnosis not present

## 2022-04-30 DIAGNOSIS — M545 Low back pain, unspecified: Secondary | ICD-10-CM | POA: Diagnosis present

## 2022-04-30 DIAGNOSIS — Z882 Allergy status to sulfonamides status: Secondary | ICD-10-CM

## 2022-04-30 DIAGNOSIS — K219 Gastro-esophageal reflux disease without esophagitis: Secondary | ICD-10-CM | POA: Diagnosis present

## 2022-04-30 DIAGNOSIS — Z1152 Encounter for screening for COVID-19: Secondary | ICD-10-CM

## 2022-04-30 DIAGNOSIS — E876 Hypokalemia: Secondary | ICD-10-CM | POA: Diagnosis present

## 2022-04-30 DIAGNOSIS — K529 Noninfective gastroenteritis and colitis, unspecified: Secondary | ICD-10-CM

## 2022-04-30 DIAGNOSIS — G8929 Other chronic pain: Secondary | ICD-10-CM | POA: Diagnosis present

## 2022-04-30 DIAGNOSIS — R112 Nausea with vomiting, unspecified: Secondary | ICD-10-CM | POA: Diagnosis present

## 2022-04-30 DIAGNOSIS — D631 Anemia in chronic kidney disease: Secondary | ICD-10-CM | POA: Diagnosis present

## 2022-04-30 DIAGNOSIS — N189 Chronic kidney disease, unspecified: Secondary | ICD-10-CM | POA: Diagnosis present

## 2022-04-30 DIAGNOSIS — Z79899 Other long term (current) drug therapy: Secondary | ICD-10-CM

## 2022-04-30 DIAGNOSIS — U099 Post covid-19 condition, unspecified: Principal | ICD-10-CM | POA: Diagnosis present

## 2022-04-30 DIAGNOSIS — E860 Dehydration: Secondary | ICD-10-CM | POA: Diagnosis present

## 2022-04-30 DIAGNOSIS — K3184 Gastroparesis: Principal | ICD-10-CM | POA: Diagnosis present

## 2022-04-30 DIAGNOSIS — M549 Dorsalgia, unspecified: Secondary | ICD-10-CM | POA: Diagnosis present

## 2022-04-30 DIAGNOSIS — N1832 Chronic kidney disease, stage 3b: Secondary | ICD-10-CM | POA: Diagnosis not present

## 2022-04-30 DIAGNOSIS — Z886 Allergy status to analgesic agent status: Secondary | ICD-10-CM

## 2022-04-30 DIAGNOSIS — D72829 Elevated white blood cell count, unspecified: Secondary | ICD-10-CM | POA: Diagnosis present

## 2022-04-30 DIAGNOSIS — I129 Hypertensive chronic kidney disease with stage 1 through stage 4 chronic kidney disease, or unspecified chronic kidney disease: Secondary | ICD-10-CM | POA: Diagnosis present

## 2022-04-30 DIAGNOSIS — K5909 Other constipation: Secondary | ICD-10-CM | POA: Diagnosis present

## 2022-04-30 HISTORY — DX: Hyperlipidemia, unspecified: E78.5

## 2022-04-30 HISTORY — DX: Zoster without complications: B02.9

## 2022-04-30 HISTORY — DX: Prediabetes: R73.03

## 2022-04-30 HISTORY — DX: Chronic kidney disease, stage 3 unspecified: N18.30

## 2022-04-30 HISTORY — DX: Radiculopathy, lumbar region: M54.16

## 2022-04-30 HISTORY — DX: Other chronic pain: G89.29

## 2022-04-30 LAB — URINALYSIS, ROUTINE W REFLEX MICROSCOPIC
Bacteria, UA: NONE SEEN
Bilirubin Urine: NEGATIVE
Glucose, UA: NEGATIVE mg/dL
Hgb urine dipstick: NEGATIVE
Ketones, ur: NEGATIVE mg/dL
Leukocytes,Ua: NEGATIVE
Nitrite: NEGATIVE
Protein, ur: 30 mg/dL — AB
Specific Gravity, Urine: 1.026 (ref 1.005–1.030)
pH: 5 (ref 5.0–8.0)

## 2022-04-30 LAB — COMPREHENSIVE METABOLIC PANEL
ALT: 14 U/L (ref 0–44)
AST: 19 U/L (ref 15–41)
Albumin: 3.8 g/dL (ref 3.5–5.0)
Alkaline Phosphatase: 50 U/L (ref 38–126)
Anion gap: 9 (ref 5–15)
BUN: 27 mg/dL — ABNORMAL HIGH (ref 8–23)
CO2: 22 mmol/L (ref 22–32)
Calcium: 8.9 mg/dL (ref 8.9–10.3)
Chloride: 107 mmol/L (ref 98–111)
Creatinine, Ser: 1.33 mg/dL — ABNORMAL HIGH (ref 0.61–1.24)
GFR, Estimated: >60 mL/min (ref >60)
Glucose, Bld: 129 mg/dL — ABNORMAL HIGH (ref 70–99)
Potassium: 3.5 mmol/L (ref 3.5–5.1)
Sodium: 138 mmol/L (ref 135–145)
Total Bilirubin: 0.5 mg/dL (ref 0.3–1.2)
Total Protein: 7.5 g/dL (ref 6.5–8.1)

## 2022-04-30 LAB — CBC WITH DIFFERENTIAL/PLATELET
Abs Immature Granulocytes: 0.05 10*3/uL (ref 0.00–0.07)
Basophils Absolute: 0 10*3/uL (ref 0.0–0.1)
Basophils Relative: 0 %
Eosinophils Absolute: 0 10*3/uL (ref 0.0–0.5)
Eosinophils Relative: 0 %
HCT: 33.6 % — ABNORMAL LOW (ref 39.0–52.0)
Hemoglobin: 11.9 g/dL — ABNORMAL LOW (ref 13.0–17.0)
Immature Granulocytes: 0 %
Lymphocytes Relative: 6 %
Lymphs Abs: 0.9 10*3/uL (ref 0.7–4.0)
MCH: 31.7 pg (ref 26.0–34.0)
MCHC: 35.4 g/dL (ref 30.0–36.0)
MCV: 89.6 fL (ref 80.0–100.0)
Monocytes Absolute: 0.8 10*3/uL (ref 0.1–1.0)
Monocytes Relative: 6 %
Neutro Abs: 13.2 10*3/uL — ABNORMAL HIGH (ref 1.7–7.7)
Neutrophils Relative %: 88 %
Platelets: 294 10*3/uL (ref 150–400)
RBC: 3.75 MIL/uL — ABNORMAL LOW (ref 4.22–5.81)
RDW: 13.4 % (ref 11.5–15.5)
WBC: 15.1 10*3/uL — ABNORMAL HIGH (ref 4.0–10.5)
nRBC: 0 % (ref 0.0–0.2)

## 2022-04-30 LAB — RESP PANEL BY RT-PCR (FLU A&B, COVID) ARPGX2
Influenza A by PCR: NEGATIVE
Influenza B by PCR: NEGATIVE
SARS Coronavirus 2 by RT PCR: NEGATIVE

## 2022-04-30 MED ORDER — ONDANSETRON HCL 4 MG PO TABS
4.0000 mg | ORAL_TABLET | Freq: Four times a day (QID) | ORAL | Status: DC | PRN
  Administered 2022-04-30 – 2022-05-01 (×3): 4 mg via ORAL
  Filled 2022-04-30 (×3): qty 1

## 2022-04-30 MED ORDER — FENTANYL CITRATE PF 50 MCG/ML IJ SOSY: 25.0000 ug | PREFILLED_SYRINGE | INTRAMUSCULAR | Status: DC | PRN

## 2022-04-30 MED ORDER — ONDANSETRON HCL 4 MG/2ML IJ SOLN
4.0000 mg | Freq: Once | INTRAMUSCULAR | Status: AC
  Administered 2022-04-30: 4 mg via INTRAVENOUS
  Filled 2022-04-30: qty 2

## 2022-04-30 MED ORDER — PANTOPRAZOLE SODIUM 40 MG IV SOLR
40.0000 mg | INTRAVENOUS | Status: DC
  Administered 2022-04-30 – 2022-05-02 (×3): 40 mg via INTRAVENOUS
  Filled 2022-04-30 (×3): qty 10

## 2022-04-30 MED ORDER — PROCHLORPERAZINE EDISYLATE 10 MG/2ML IJ SOLN: 10.0000 mg | INTRAMUSCULAR | Status: DC | PRN

## 2022-04-30 MED ORDER — IOHEXOL 300 MG/ML  SOLN
100.0000 mL | Freq: Once | INTRAMUSCULAR | Status: AC | PRN
  Administered 2022-04-30: 100 mL via INTRAVENOUS

## 2022-04-30 MED ORDER — SODIUM CHLORIDE 0.9 % IV BOLUS
1000.0000 mL | Freq: Once | INTRAVENOUS | Status: AC
  Administered 2022-04-30: 1000 mL via INTRAVENOUS

## 2022-04-30 MED ORDER — HYDRALAZINE HCL 20 MG/ML IJ SOLN: 10.0000 mg | INTRAMUSCULAR | Status: DC | PRN

## 2022-04-30 MED ORDER — ENOXAPARIN SODIUM 40 MG/0.4ML IJ SOSY
40.0000 mg | PREFILLED_SYRINGE | INTRAMUSCULAR | Status: DC
  Administered 2022-04-30 – 2022-05-02 (×3): 40 mg via SUBCUTANEOUS
  Filled 2022-04-30 (×3): qty 0.4

## 2022-04-30 MED ORDER — ACETAMINOPHEN 325 MG PO TABS
650.0000 mg | ORAL_TABLET | Freq: Four times a day (QID) | ORAL | Status: DC | PRN
  Filled 2022-04-30: qty 2

## 2022-04-30 MED ORDER — ONDANSETRON HCL 4 MG/2ML IJ SOLN: 4.0000 mg | Freq: Four times a day (QID) | INTRAMUSCULAR | Status: DC | PRN

## 2022-04-30 MED ORDER — SODIUM CHLORIDE 0.9 % IV SOLN
2.0000 g | Freq: Once | INTRAVENOUS | Status: AC
  Administered 2022-04-30: 2 g via INTRAVENOUS
  Filled 2022-04-30: qty 20

## 2022-04-30 MED ORDER — OXYCODONE HCL 5 MG PO TABS
5.0000 mg | ORAL_TABLET | ORAL | Status: DC | PRN
  Administered 2022-04-30 – 2022-05-03 (×13): 10 mg via ORAL
  Filled 2022-04-30 (×13): qty 2

## 2022-04-30 MED ORDER — SODIUM CHLORIDE 0.9 % IV SOLN
2.0000 g | INTRAVENOUS | Status: DC
  Administered 2022-05-01 – 2022-05-02 (×2): 2 g via INTRAVENOUS
  Filled 2022-04-30 (×2): qty 20

## 2022-04-30 MED ORDER — ACETAMINOPHEN 650 MG RE SUPP: 650.0000 mg | Freq: Four times a day (QID) | RECTAL | Status: DC | PRN

## 2022-04-30 MED ORDER — LORAZEPAM 2 MG/ML IJ SOLN
0.5000 mg | Freq: Once | INTRAMUSCULAR | Status: AC
  Administered 2022-04-30: 0.5 mg via INTRAVENOUS
  Filled 2022-04-30: qty 1

## 2022-04-30 MED ORDER — LACTATED RINGERS IV SOLN: INTRAVENOUS | Status: DC

## 2022-04-30 NOTE — H&P (Signed)
Date: 04/30/2022               Patient Name:  John Jordan MRN: [id number]  DOB: [birth year] Age / Sex: 61 y.o., male   PCP: [facility] Service: Internal Medicine Teaching Service              Attending Physician: Dr. Irwin Brakeman, MD    First Contact: Stanford Breed, MS     Second Contact: Dr. Irwin Brakeman, MD                         Chief Complaint: Intractable nausea and vomiting   History of Present Illness: John Jordan is a 61 year old male with a history of recent COVID infection (tested positive two weeks ago), diverticulitis, hypertension, chronic back pain with radiculopathy, and GERD who presents to the ED with intractable nausea and vomiting. Patient was discharged from the ED yesterday with vomiting and left elbow pain secondary to a burn he received from falling asleep on a heating pad. Patient was discharged yesterday with oral Keflex (given concern for possible cellulitis) and Phenergan. Patient was unable to take medicine due to vomiting. Patient states that since testing positive for COVID two week ago, he has been unable to keep food/liquid down. He has lost 12 pounds during this time. He denies abdominal pain, diarrhea, chest pain, or dyspnea. He expresses concern that he "really needs to eat" and also with withdrawal concerns because he has not been able to take his Lyrica or Baclofen for 2+ days.   ED Course: Chest X-ray and CT abdomen unremarkable. Negative for Flu/COVID. U/A unremarkable aside from mild proteinuria (30). Creatinine is 1.33 (1.42 yesterday). Unclear baseline but patient has a history of CKD IIIA. Patient is afebrile but with leukocytosis (15.1). Patient given NS bolus.  Meds:  Current Meds  Medication Sig   baclofen (LIORESAL) 20 MG tablet Take 10-20 mg by mouth in the morning, at noon, and at bedtime. 20mg  morning and evening and 10mg  at noon   cephALEXin (KEFLEX) 500 MG capsule Take 1 capsule (500 mg  total) by mouth 4 (four) times daily.   cholecalciferol (VITAMIN D3) 25 MCG (1000 UNIT) tablet Take 1,000 Units by mouth daily.   Cyanocobalamin (VITAMIN B-12 PO) Take 1 tablet by mouth daily.   hydrochlorothiazide (MICROZIDE) 12.5 MG capsule Take 12.5 mg by mouth daily.   lisinopril (PRINIVIL,ZESTRIL) 10 MG tablet Take 10 mg by mouth daily.   oxyCODONE (ROXICODONE) 15 MG immediate release tablet Take 15 mg by mouth every 6 (six) hours.   pantoprazole (PROTONIX) 40 MG tablet Take 40 mg by mouth 2 (two) times daily.   pregabalin (LYRICA) 75 MG capsule Take 150 mg by mouth in the morning and at bedtime.   promethazine (PHENERGAN) 25 MG tablet Take 1 tablet (25 mg total) by mouth every 6 (six) hours as needed for nausea or vomiting.   saccharomyces boulardii (FLORASTOR) 250 MG capsule Take 250 mg by mouth 2 (two) times daily.     Allergies: Allergies as of 04/30/2022 - Review Complete 04/30/2022  Allergen Reaction Noted   Nsaids Other (See Comments) 06/06/2017   Sulfa antibiotics Rash 12/07/2015   Past Medical History:  Diagnosis Date   Allergy    seasonal allergies   Anxiety    Arthritis    generalized   Chronic back pain    Chronic renal failure, stage  3 (moderate), stage 3a    Depression    Diverticulitis    GERD (gastroesophageal reflux disease)    on meds   GERD (gastroesophageal reflux disease)    Herpes zoster    Hyperlipidemia    Hypertension    on meds   Lumbar radiculopathy    Prediabetes     Social History: Lives at home with wife, quit smoking tobacco last year (1 cigarette/day prior), denies alcohol or illegal drug use.  Review of Systems: A complete ROS was negative except as per HPI.   Physical Exam: Blood pressure (!) 141/87, pulse 74, temperature 98.2 F (36.8 C), temperature source Oral, resp. rate 18, height 5\' 11"  (1.803 m), weight 99.8 kg, SpO2 96 %.  General:lying in bed, in no acute distress Head:normocephalic, atraumatic  Neck:supple without  masses or lymphadenopathy Cardiovascular: regular rater and rhythm without murmurs, rubs, or gallops Respiratory: normal respiratory effort, lungs CTAB Abdominal: normoactive bowel sounds, no tenderness to palpation Skin: warm, dry, burn on left olecranon with surrounding erythema, slight reduced skin turgor Neurocognitive: alert and oriented x 3 Psychiatric: Normal mood and affect  Assessment & Plan by Problem: Principal Problem:   Intractable nausea and vomiting Active Problems:   Cellulitis of left elbow   CKD (chronic kidney disease)   Leukocytosis   Anemia in chronic kidney disease (CKD)   Essential hypertension   GERD (gastroesophageal reflux disease)   Diverticulosis  Intractable nausea and vomiting Patient has been dealing with this for the past two weeks since testing positive for COVID - endorsing 12 pounds of weight loss. He has tried oral Zofran and Promethazine with little relief. Patient appears mildly dehydrated on exam. Patient received bolus of NS in the ED and will be continued on LR infusion. IV Zofran and Compazine also ordered PRN. Clear liquid diet. -Follow up with I/O's -Consider GI consult if symptoms do not improve  Cellulitis of left elbow Patient burned elbow on heating pad one week ago. Patient endorses mild pain but it has improved. Vitals are stable but patient does have leukocytosis (15.1). Patient to be started on IV Rocephin. Wound and blood cultures pending. -Monitor CBC -Follow-up with culture results  Chronic Kidney Disease Stage IIIA Creatinine 1.33 on admission (1.42 yesterday). Patient on IV hydration. -Repeat BMP -Avoid nephrotoxic medications if possible  Leukocytosis Possibly secondary to cellulitis of left elbow. Blood and wound cultures pending. Patient on Day 1 on IV Rocephin. -Trend vitals and CBC  Anemia in Chronic Kidney Disease Hgb is 11.9 (12.0 in March 2023) -Trend CBC   Essential Hypertension Stable. Holding home  lisinopril due to elevated creatinine and inability to tolerate PO. -Hydralazine if SBP > 170  GERD Patient started on IV Protonix  Diverticulosis Stable  Dispo: Admit patient to Observation with expected length of stay less than 2 midnights.  Signed: Stanford Breed, Medical Student 04/30/2022, 4:01 PM

## 2022-04-30 NOTE — ED Triage Notes (Signed)
Pt was seen here yesterday and given antibiotics for his elbow wound; pt states when he got home he started vomiting last night with fever of 102 and profuse sweating during the night

## 2022-05-01 DIAGNOSIS — K3184 Gastroparesis: Secondary | ICD-10-CM | POA: Diagnosis present

## 2022-05-01 DIAGNOSIS — K5790 Diverticulosis of intestine, part unspecified, without perforation or abscess without bleeding: Secondary | ICD-10-CM | POA: Diagnosis present

## 2022-05-01 DIAGNOSIS — K5909 Other constipation: Secondary | ICD-10-CM | POA: Diagnosis present

## 2022-05-01 DIAGNOSIS — N1831 Chronic kidney disease, stage 3a: Secondary | ICD-10-CM | POA: Diagnosis present

## 2022-05-01 DIAGNOSIS — M545 Low back pain, unspecified: Secondary | ICD-10-CM | POA: Diagnosis present

## 2022-05-01 DIAGNOSIS — Z886 Allergy status to analgesic agent status: Secondary | ICD-10-CM | POA: Diagnosis not present

## 2022-05-01 DIAGNOSIS — I129 Hypertensive chronic kidney disease with stage 1 through stage 4 chronic kidney disease, or unspecified chronic kidney disease: Secondary | ICD-10-CM | POA: Diagnosis present

## 2022-05-01 DIAGNOSIS — K219 Gastro-esophageal reflux disease without esophagitis: Secondary | ICD-10-CM | POA: Diagnosis present

## 2022-05-01 DIAGNOSIS — I10 Essential (primary) hypertension: Secondary | ICD-10-CM | POA: Diagnosis not present

## 2022-05-01 DIAGNOSIS — D631 Anemia in chronic kidney disease: Secondary | ICD-10-CM | POA: Diagnosis present

## 2022-05-01 DIAGNOSIS — E876 Hypokalemia: Secondary | ICD-10-CM | POA: Diagnosis not present

## 2022-05-01 DIAGNOSIS — Z79899 Other long term (current) drug therapy: Secondary | ICD-10-CM | POA: Diagnosis not present

## 2022-05-01 DIAGNOSIS — N1832 Chronic kidney disease, stage 3b: Secondary | ICD-10-CM | POA: Diagnosis not present

## 2022-05-01 DIAGNOSIS — E860 Dehydration: Secondary | ICD-10-CM | POA: Diagnosis present

## 2022-05-01 DIAGNOSIS — G8929 Other chronic pain: Secondary | ICD-10-CM | POA: Diagnosis present

## 2022-05-01 DIAGNOSIS — Z882 Allergy status to sulfonamides status: Secondary | ICD-10-CM | POA: Diagnosis not present

## 2022-05-01 DIAGNOSIS — Z1152 Encounter for screening for COVID-19: Secondary | ICD-10-CM | POA: Diagnosis not present

## 2022-05-01 DIAGNOSIS — L03114 Cellulitis of left upper limb: Secondary | ICD-10-CM | POA: Diagnosis present

## 2022-05-01 DIAGNOSIS — R112 Nausea with vomiting, unspecified: Secondary | ICD-10-CM | POA: Diagnosis present

## 2022-05-01 DIAGNOSIS — M549 Dorsalgia, unspecified: Secondary | ICD-10-CM | POA: Diagnosis present

## 2022-05-01 DIAGNOSIS — U099 Post covid-19 condition, unspecified: Secondary | ICD-10-CM | POA: Diagnosis present

## 2022-05-01 LAB — CBC
HCT: 29.8 % — ABNORMAL LOW (ref 39.0–52.0)
Hemoglobin: 10.2 g/dL — ABNORMAL LOW (ref 13.0–17.0)
MCH: 31 pg (ref 26.0–34.0)
MCHC: 34.2 g/dL (ref 30.0–36.0)
MCV: 90.6 fL (ref 80.0–100.0)
Platelets: 294 10*3/uL (ref 150–400)
RBC: 3.29 MIL/uL — ABNORMAL LOW (ref 4.22–5.81)
RDW: 13.3 % (ref 11.5–15.5)
WBC: 12.7 10*3/uL — ABNORMAL HIGH (ref 4.0–10.5)
nRBC: 0 % (ref 0.0–0.2)

## 2022-05-01 LAB — BASIC METABOLIC PANEL
Anion gap: 8 (ref 5–15)
BUN: 28 mg/dL — ABNORMAL HIGH (ref 8–23)
CO2: 20 mmol/L — ABNORMAL LOW (ref 22–32)
Calcium: 8.8 mg/dL — ABNORMAL LOW (ref 8.9–10.3)
Chloride: 111 mmol/L (ref 98–111)
Creatinine, Ser: 1.27 mg/dL — ABNORMAL HIGH (ref 0.61–1.24)
GFR, Estimated: >60 mL/min (ref >60)
Glucose, Bld: 109 mg/dL — ABNORMAL HIGH (ref 70–99)
Potassium: 3.2 mmol/L — ABNORMAL LOW (ref 3.5–5.1)
Sodium: 139 mmol/L (ref 135–145)

## 2022-05-01 LAB — MAGNESIUM: Magnesium: 2.2 mg/dL (ref 1.7–2.4)

## 2022-05-01 LAB — HIV ANTIBODY (ROUTINE TESTING W REFLEX): HIV Screen 4th Generation wRfx: NONREACTIVE

## 2022-05-01 MED ORDER — LISINOPRIL 10 MG PO TABS
10.0000 mg | ORAL_TABLET | Freq: Every day | ORAL | Status: DC
  Administered 2022-05-03: 10 mg via ORAL
  Filled 2022-05-01 (×4): qty 1

## 2022-05-01 MED ORDER — POTASSIUM CHLORIDE 10 MEQ/100ML IV SOLN
10.0000 meq | INTRAVENOUS | Status: AC
  Administered 2022-05-01 (×4): 10 meq via INTRAVENOUS
  Filled 2022-05-01 (×4): qty 100

## 2022-05-01 MED ORDER — METOCLOPRAMIDE HCL 5 MG/ML IJ SOLN
5.0000 mg | Freq: Three times a day (TID) | INTRAMUSCULAR | Status: DC
  Administered 2022-05-01 – 2022-05-02 (×3): 5 mg via INTRAVENOUS
  Filled 2022-05-01 (×3): qty 2

## 2022-05-01 MED ORDER — ONDANSETRON HCL 4 MG PO TABS
4.0000 mg | ORAL_TABLET | Freq: Four times a day (QID) | ORAL | Status: DC
  Administered 2022-05-02 – 2022-05-03 (×5): 4 mg via ORAL
  Filled 2022-05-01 (×5): qty 1

## 2022-05-01 MED ORDER — LACTATED RINGERS IV SOLN: INTRAVENOUS | Status: DC

## 2022-05-01 MED ORDER — BACLOFEN 10 MG PO TABS
10.0000 mg | ORAL_TABLET | Freq: Three times a day (TID) | ORAL | Status: DC
  Administered 2022-05-01 – 2022-05-03 (×6): 10 mg via ORAL
  Filled 2022-05-01 (×6): qty 1

## 2022-05-01 MED ORDER — ONDANSETRON HCL 4 MG/2ML IJ SOLN
4.0000 mg | Freq: Four times a day (QID) | INTRAMUSCULAR | Status: DC
  Administered 2022-05-01 – 2022-05-03 (×3): 4 mg via INTRAVENOUS
  Filled 2022-05-01 (×4): qty 2

## 2022-05-01 NOTE — Assessment & Plan Note (Signed)
--   continue IV protonix

## 2022-05-01 NOTE — Assessment & Plan Note (Addendum)
--   BPs well controlled, following, restarted home lisinopril 10 mg daily

## 2022-05-01 NOTE — Assessment & Plan Note (Addendum)
--   unable to take home oral meds, IV pain meds ordered for now

## 2022-05-01 NOTE — Assessment & Plan Note (Signed)
--   continue to monitor renal function -- renal function has improved with IV fluid hydration

## 2022-05-01 NOTE — Assessment & Plan Note (Signed)
--   CT reviewed, no evidence of diverticulitis

## 2022-05-01 NOTE — Assessment & Plan Note (Addendum)
--   follow Hg with hydration, now 11.1

## 2022-05-01 NOTE — Consult Note (Signed)
Referring Provider: No ref. provider found Primary Care Physician:  [facility] Primary Gastroenterologist:  [facility].  Reason for Consultation: Nausea and vomiting  HPI: Pleasant 61 year old male [employer] veteran contracted a COVID infection 2 weeks ago.  Treated with Paxil of bed.  Respiratory symptoms improved however the time he developed COVID symptoms he started having nausea and vomiting without any significant abdominal pain has had persisting nausea and vomiting nearly the past 2 weeks.  Lost 12 pounds.  Presented to the ED yesterday and was subsequently admitted.  Mild leukocytosis and anemia.  CT of the abdomen and pelvis negative for any acute abdominal process.  Diverticulosis.  History of chronic kidney disease.  Patient denies any melena, hematochezia or hematemesis. Baseline GERD symptoms without dysphagia well-controlled on Protonix 40 mg daily. Chronically constipated.  Takes mag citrate, and fleets enemas on a regular basis to facilitate bowel function every other day.  Also takes a stool softener.  He reports a negative colonoscopy 2 years ago through the [place] in [place].  Positive family history of colon cancer puts him in a 5-year screening program. Of note, patient has some type of neurogenic bladder for which he has self cathed daily for some time. States he had a EGD for reflux several years ago and was told everything was normal. History of chronic neuropathy and radiculopathy/chronic low back pain for which she takes opioids on a daily basis in addition to baclofen and Lyrica. Also, patient fell asleep with a heating pad on his left elbow and sustained a second-degree burn and possible cellulitis.  Recently started on Keflex. Rocephin started this admission.  Today the patient was started on scheduled Reglan 5 mg IV every 6 hours in addition to Zofran.  He tells me his nausea has gotten much better today already and has been tolerating clear  liquids.  Past Medical History:  Diagnosis Date   Allergy    seasonal allergies   Anxiety    Arthritis    generalized   Chronic back pain    Chronic renal failure, stage 3 (moderate), stage 3a    Depression    Diverticulitis    GERD (gastroesophageal reflux disease)    on meds   GERD (gastroesophageal reflux disease)    Herpes zoster    Hyperlipidemia    Hypertension    on meds   Lumbar radiculopathy    Prediabetes     Past Surgical History:  Procedure Laterality Date   BACK SURGERY     [facility]   HERNIA REPAIR Bilateral    bilateral inguinal hernia repair   WISDOM TOOTH EXTRACTION      Prior to Admission medications   Medication Sig Start Date End Date Taking? Authorizing Provider  baclofen (LIORESAL) 20 MG tablet Take 10-20 mg by mouth in the morning, at noon, and at bedtime. 20mg  morning and evening and 10mg  at noon 08/28/21  Yes [provider]  cephALEXin (KEFLEX) 500 MG capsule Take 1 capsule (500 mg total) by mouth 4 (four) times daily. 04/29/22  Yes Raul Del, Conner M, PA-C  cholecalciferol (VITAMIN D3) 25 MCG (1000 UNIT) tablet Take 1,000 Units by mouth daily.   Yes [provider]  Cyanocobalamin (VITAMIN B-12 PO) Take 1 tablet by mouth daily.   Yes [provider]  hydrochlorothiazide (MICROZIDE) 12.5 MG capsule Take 12.5 mg by mouth daily. 08/28/21  Yes [provider]  lisinopril (PRINIVIL,ZESTRIL) 10 MG tablet Take  10 mg by mouth daily.   Yes [provider]  oxyCODONE (ROXICODONE) 15 MG immediate release tablet Take 15 mg by mouth every 6 (six) hours. 08/04/21  Yes [provider]  pantoprazole (PROTONIX) 40 MG tablet Take 40 mg by mouth 2 (two) times daily. 12/31/19  Yes [provider]  pregabalin (LYRICA) 75 MG capsule Take 150 mg by mouth in the morning and at bedtime. 08/17/21  Yes [provider]  promethazine (PHENERGAN) 25 MG  tablet Take 1 tablet (25 mg total) by mouth every 6 (six) hours as needed for nausea or vomiting. 04/29/22  Yes Fleming, Conner M, PA-C  saccharomyces boulardii (FLORASTOR) 250 MG capsule Take 250 mg by mouth 2 (two) times daily.   Yes [provider]  HYDROcodone-acetaminophen (NORCO/VICODIN) 5-325 MG tablet Take 1 tablet by mouth every 6 (six) hours as needed. Patient not taking: Reported on 09/04/2021 05/30/20   Milton Ferguson, MD  ondansetron (ZOFRAN ODT) 4 MG disintegrating tablet 4mg  ODT q4 hours prn nausea/vomit Patient not taking: Reported on 04/29/2022 05/30/20   Milton Ferguson, MD    Current Facility-Administered Medications  Medication Dose Route Frequency Provider Last Rate Last Admin   acetaminophen (TYLENOL) tablet 650 mg  650 mg Oral Q6H PRN Johnson, Clanford L, MD       Or   acetaminophen (TYLENOL) suppository 650 mg  650 mg Rectal Q6H PRN Johnson, Clanford L, MD       baclofen (LIORESAL) tablet 10 mg  10 mg Oral TID Wynetta Emery, Clanford L, MD   10 mg at 05/01/22 1600   cefTRIAXone (ROCEPHIN) 2 g in sodium chloride 0.9 % 100 mL IVPB  2 g Intravenous Q24H Johnson, Clanford L, MD 200 mL/hr at 05/01/22 1415 2 g at 05/01/22 1415   enoxaparin (LOVENOX) injection 40 mg  40 mg Subcutaneous Q24H Johnson, Clanford L, MD   40 mg at 05/01/22 1431   fentaNYL (SUBLIMAZE) injection 25 mcg  25 mcg Intravenous Q2H PRN Johnson, Clanford L, MD       hydrALAZINE (APRESOLINE) injection 10 mg  10 mg Intravenous Q4H PRN Johnson, Clanford L, MD       lactated ringers infusion   Intravenous Continuous Johnson, Clanford L, MD 65 mL/hr at 05/01/22 1453 Rate Change at 05/01/22 1453   lisinopril (ZESTRIL) tablet 10 mg  10 mg Oral Daily Johnson, Clanford L, MD       metoCLOPramide (REGLAN) injection 5 mg  5 mg Intravenous Q8H Johnson, Clanford L, MD   5 mg at 05/01/22 1423   ondansetron (ZOFRAN) tablet 4 mg  4 mg Oral Q6H Johnson, Clanford L, MD       Or   ondansetron (ZOFRAN) injection 4 mg  4 mg  Intravenous Q6H Johnson, Clanford L, MD   4 mg at 05/01/22 1409   oxyCODONE (Oxy IR/ROXICODONE) immediate release tablet 5-10 mg  5-10 mg Oral Q4H PRN Johnson, Clanford L, MD   10 mg at 05/01/22 1257   pantoprazole (PROTONIX) injection 40 mg  40 mg Intravenous Q24H Johnson, Clanford L, MD   40 mg at 05/01/22 1431   prochlorperazine (COMPAZINE) injection 10 mg  10 mg Intravenous Q4H PRN Wynetta Emery, Clanford L, MD        Allergies as of 04/30/2022 - Review Complete 04/30/2022  Allergen Reaction Noted   Nsaids Other (See Comments) 06/06/2017   Sulfa antibiotics Rash 12/07/2015    Family History  Problem Relation Age of Onset   Kidney cancer Father 79  Kidney disease Father    Colon cancer Paternal Aunt    Esophageal cancer Neg Hx    Rectal cancer Neg Hx    Stomach cancer Neg Hx     Social History   Socioeconomic History   Marital status: Married    Spouse name: Not on file   Number of children: Not on file   Years of education: Not on file   Highest education level: Not on file  Occupational History   Not on file  Tobacco Use   Smoking status: Some Days   Smokeless tobacco: Never  Vaping Use   Vaping Use: Never used  Substance and Sexual Activity   Alcohol use: Not Currently   Drug use: No   Sexual activity: Not on file  Other Topics Concern   Not on file  Social History Narrative   Not on file   Social Determinants of Health   Financial Resource Strain: Not on file  Food Insecurity: No Food Insecurity (04/30/2022)   Hunger Vital Sign    Worried About Running Out of Food in the Last Year: Never true    Ran Out of Food in the Last Year: Never true  Transportation Needs: No Transportation Needs (04/30/2022)   PRAPARE - Hydrologist (Medical): No    Lack of Transportation (Non-Medical): No  Physical Activity: Not on file  Stress: Not on file  Social Connections: Not on file  Intimate Partner Violence: Not At Risk (04/30/2022)    Humiliation, Afraid, Rape, and Kick questionnaire    Fear of Current or Ex-Partner: No    Emotionally Abused: No    Physically Abused: No    Sexually Abused: No    Review of Systems:  Physical Exam: Vital signs in last 24 hours: Temp:  [97.9 F (36.6 C)-98.9 F (37.2 C)] 98.6 F (37 C) (10/28 1335) Pulse Rate:  [64-83] 74 (10/28 1335) Resp:  [15-18] 18 (10/28 0636) BP: (127-142)/(80-97) 132/83 (10/28 1335) SpO2:  [93 %-100 %] 98 % (10/28 1335) Weight:  [90.9 kg] 90.9 kg (10/27 1848) Last BM Date : 04/26/22 General:   Alert,  , pleasant and cooperative in NAD -accompanied by spouse and multiple family members Head:  Normocephalic and atraumatic. Eyes:  Sclera clear, no icterus.   Conjunctiva pink. Lungs:  Clear throughout to auscultation.   No wheezes, crackles, or rhonchi. No acute distress. Heart:  Regular rate and rhythm; no murmurs, clicks, rubs,  or gallops. Abdomen: Nondistended.  Positive bowel sounds soft and nontender.  No succussion splash.  Intake/Output from previous day: 10/27 0701 - 10/28 0700 In: 1684.5 [I.V.:584.5; IV Piggyback:1100] Out: -  Intake/Output this shift: Total I/O In: 1539.4 [P.O.:360; I.V.:515; IV Piggyback:664.4] Out: -   Lab Results: Recent Labs    04/29/22 1204 04/30/22 1056 05/01/22 0551  WBC 12.8* 15.1* 12.7*  HGB 12.5* 11.9* 10.2*  HCT 35.9* 33.6* 29.8*  PLT 297 294 294   BMET Recent Labs    04/29/22 1204 04/30/22 1056 05/01/22 0551  NA 138 138 139  K 3.7 3.5 3.2*  CL 105 107 111  CO2 22 22 20*  GLUCOSE 156* 129* 109*  BUN 28* 27* 28*  CREATININE 1.42* 1.33* 1.27*  CALCIUM 9.6 8.9 8.8*   LFT Recent Labs    04/30/22 1056  PROT 7.5  ALBUMIN 3.8  AST 19  ALT 14  ALKPHOS 50  BILITOT 0.5   PT/INR No results for input(s): "LABPROT", "INR" in the last 72 hours.  Hepatitis Panel No results for input(s): "HEPBSAG", "HCVAB", "HEPAIGM", "HEPBIGM" in the last 72 hours. C-Diff No results for input(s): "CDIFFTOX" in the  last 72 hours.  Studies/Results: CT ABDOMEN PELVIS W CONTRAST  Result Date: 04/30/2022 CLINICAL DATA:  Fever, emesis, abdominal pain. EXAM: CT ABDOMEN AND PELVIS WITH CONTRAST TECHNIQUE: Multidetector CT imaging of the abdomen and pelvis was performed using the standard protocol following bolus administration of intravenous contrast. RADIATION DOSE REDUCTION: This exam was performed according to the departmental dose-optimization program which includes automated exposure control, adjustment of the mA and/or kV according to patient size and/or use of iterative reconstruction technique. CONTRAST:  130mL OMNIPAQUE IOHEXOL 300 MG/ML  SOLN COMPARISON:  CT abdomen/pelvis 05/30/2020 FINDINGS: Lower chest: There is atelectasis/scarring in the lung bases. There is no pleural effusion. The imaged heart is unremarkable. Hepatobiliary: The liver and gallbladder are unremarkable. There is no biliary ductal dilatation. Pancreas: Unremarkable. Spleen: Unremarkable. Adrenals/Urinary Tract: The 1.4 cm right adrenal nodule has been present since 2018, consistent with a benign adenoma requiring no specific imaging follow-up. The left adrenal is unremarkable. Hypodense lesions in the left kidney likely reflect benign cysts for which no specific imaging follow-up is required, unchanged. There are no suspicious parenchymal lesions. There are no stones in either kidney or along the course of either ureter. There is no hydronephrosis or hydroureter. The bladder is unremarkable. There is symmetric excretion of contrast into the collecting systems on the delayed images. Stomach/Bowel: The stomach is unremarkable. There is no evidence of bowel obstruction. There is no abnormal bowel wall thickening or inflammatory change. The appendix is normal. There is colonic diverticulosis without evidence of acute diverticulitis. Vascular/Lymphatic: The abdominal aorta is normal in course and caliber. The major branch vessels are patent. The main  portal and splenic veins are patent. There is no abdominal or pelvic lymphadenopathy. Reproductive: The prostate and seminal vesicles are unremarkable. Other: There is no ascites or free air. Musculoskeletal: Postsurgical changes reflecting L5-S1 fusion are again noted. There is no acute osseous abnormality or suspicious osseous lesion. IMPRESSION: 1. No acute finding in the abdomen or pelvis. 2. Diverticulosis without evidence of acute diverticulitis. 3. Stable right adrenal adenoma requiring no specific imaging follow-up. Electronically Signed   By: Valetta Mole M.D.   On: 04/30/2022 14:01   DG Chest Port 1 View  Result Date: 04/30/2022 CLINICAL DATA:  Shortness of breath EXAM: PORTABLE CHEST 1 VIEW COMPARISON:  Chest radiograph dated 04/29/2022. FINDINGS: The heart size and mediastinal contours are within normal limits. Both lungs are clear. The visualized skeletal structures are unremarkable. IMPRESSION: No active disease. Electronically Signed   By: Zerita Boers M.D.   On: 04/30/2022 10:54    Impression: 61 year old gentleman with longstanding well-controlled GERD developed COVID about 2 weeks ago and has had nausea and vomiting without abdominal pain ever since.  Admitted here yesterday and treated symptomatically.  Reglan IV added to his regimen today.  He has noted significant improvement in his nausea symptoms. Mild leukocytosis nonspecific.  CT findings reassuring.  Anemia -more likely related to chronic kidney disease.  I suspect at baseline, patient has borderline slow GI motility  secondary to chronic opioid therapy.  Chronically constipated.  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  No alarm features at this time.  Recommendations:  Continue IV PPI  Continue scheduled low-dose Reglan IV for now; Zofran to supplement symptoms  Will need a more effective bowel regimen once  taking orally, would add MiraLAX  17 g daily to his regimen in addition to Colace 100 mg twice daily.  Would discourage enema use.  Hopefully, we can advance his diet soon.  Would favor a gastroparesis type diet with 5-6 smaller low-fat, lower fiber meals daily.  So long as he continues to improve, no need for endoscopic evaluation at this time.  We will follow along with you.     Notice:  This dictation was prepared with Dragon dictation along with smaller phrase technology. Any transcriptional errors that result from this process are unintentional and may not be corrected upon review.

## 2022-05-01 NOTE — Hospital Course (Signed)
61 year old male with a history of recent COVID infection (tested positive two weeks ago), diverticulitis, hypertension, chronic back pain with radiculopathy, and GERD who presents to the ED with intractable nausea and vomiting. Patient was discharged from the ED yesterday with vomiting and left elbow pain secondary to a burn he received from falling asleep on a heating pad. Patient was discharged yesterday with oral Keflex (given concern for possible cellulitis) and Phenergan. Patient was unable to take medicine due to vomiting.  Patient states that since testing positive for COVID two week ago, he has been unable to keep food/liquid down. He has lost 12 pounds during this time. He denies abdominal pain, diarrhea, chest pain, or dyspnea. He expresses concern that he "really needs to eat" and also with withdrawal concerns because he has not been able to take his Lyrica or Baclofen for 2+ days.    ED Course: Chest X-ray and CT abdomen unremarkable. Negative for Flu/COVID. U/A unremarkable aside from mild proteinuria (30). Creatinine is 1.33 (1.42 yesterday). Unclear baseline but patient has a history of CKD IIIA. Patient is afebrile but with leukocytosis (15.1). Patient given NS bolus.

## 2022-05-01 NOTE — Assessment & Plan Note (Signed)
--   monitor CBC/diff

## 2022-05-01 NOTE — Assessment & Plan Note (Addendum)
--   unable to take oral meds, continue IV ceftriaxone as ordered  -- WBC spiked today with concern for bursitis vs abscess on exam today -- check MRI L elbow on 10/30 when available

## 2022-05-01 NOTE — Plan of Care (Signed)

## 2022-05-01 NOTE — Assessment & Plan Note (Addendum)
--   persistent, will ask for GI consult for management assistance  -- adding metoclopramide 5 mg IV TID

## 2022-05-01 NOTE — Assessment & Plan Note (Signed)
--   K repleted, Mg repleted at 2.2
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
## 2022-05-02 LAB — CBC
HCT: 32.1 % — ABNORMAL LOW (ref 39.0–52.0)
Hemoglobin: 11.1 g/dL — ABNORMAL LOW (ref 13.0–17.0)
MCH: 31.4 pg (ref 26.0–34.0)
MCHC: 34.6 g/dL (ref 30.0–36.0)
MCV: 90.9 fL (ref 80.0–100.0)
Platelets: 344 10*3/uL (ref 150–400)
RBC: 3.53 MIL/uL — ABNORMAL LOW (ref 4.22–5.81)
RDW: 13.1 % (ref 11.5–15.5)
WBC: 13.5 10*3/uL — ABNORMAL HIGH (ref 4.0–10.5)
nRBC: 0 % (ref 0.0–0.2)

## 2022-05-02 LAB — BASIC METABOLIC PANEL
Anion gap: 9 (ref 5–15)
BUN: 19 mg/dL (ref 8–23)
CO2: 20 mmol/L — ABNORMAL LOW (ref 22–32)
Calcium: 8.9 mg/dL (ref 8.9–10.3)
Chloride: 109 mmol/L (ref 98–111)
Creatinine, Ser: 1.18 mg/dL (ref 0.61–1.24)
GFR, Estimated: >60 mL/min (ref >60)
Glucose, Bld: 100 mg/dL — ABNORMAL HIGH (ref 70–99)
Potassium: 3.7 mmol/L (ref 3.5–5.1)
Sodium: 138 mmol/L (ref 135–145)

## 2022-05-02 MED ORDER — METOCLOPRAMIDE HCL 10 MG PO TABS
5.0000 mg | ORAL_TABLET | Freq: Three times a day (TID) | ORAL | Status: DC
  Administered 2022-05-02 – 2022-05-03 (×4): 5 mg via ORAL
  Filled 2022-05-02 (×4): qty 1

## 2022-05-02 MED ORDER — GUAIFENESIN ER 600 MG PO TB12
600.0000 mg | ORAL_TABLET | Freq: Two times a day (BID) | ORAL | Status: DC
  Administered 2022-05-02 (×2): 600 mg via ORAL
  Filled 2022-05-02 (×3): qty 1

## 2022-05-02 MED ORDER — LACTATED RINGERS IV SOLN: INTRAVENOUS | Status: AC
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
## 2022-05-02 NOTE — TOC Progression Note (Signed)
  Transition of Care [facility]) Screening Note   Patient Details  Name: John Jordan Date of Birth: July 14, 1960   Transition of Care [facility]) CM/SW Contact:    Boneta Lucks, RN Phone Number: 05/02/2022, 5:17 PM   [facility] Notification done - [phone]   Transition of Care Department [facility]) has reviewed patient and no TOC needs have been identified at this time. We will continue to monitor patient advancement through interdisciplinary progression rounds. If new patient transition needs arise, please place a TOC consult.      Barriers to Discharge: Continued Medical Work up  Expected Discharge Plan and Services         Living arrangements for the past 2 months: [place]

## 2022-05-02 NOTE — Progress Notes (Signed)
PROGRESS NOTE   John Jordan  QIH:[id number] DOB: 10/10/1960 DOA: 04/30/2022 PCP: [facility]   Chief Complaint  Patient presents with   Emesis   Level of care: Med-Surg  Brief Admission History:  61 year old male with a history of recent COVID infection (tested positive two weeks ago), diverticulitis, hypertension, chronic back pain with radiculopathy, and GERD who presents to the ED with intractable nausea and vomiting. Patient was discharged from the ED yesterday with vomiting and left elbow pain secondary to a burn he received from falling asleep on a heating pad. Patient was discharged yesterday with oral Keflex (given concern for possible cellulitis) and Phenergan. Patient was unable to take medicine due to vomiting.  Patient states that since testing positive for COVID two week ago, he has been unable to keep food/liquid down. He has lost 12 pounds during this time. He denies abdominal pain, diarrhea, chest pain, or dyspnea. He expresses concern that he "really needs to eat" and also with withdrawal concerns because he has not been able to take his Lyrica or Baclofen for 2+ days.    ED Course: Chest X-ray and CT abdomen unremarkable. Negative for Flu/COVID. U/A unremarkable aside from mild proteinuria (30). Creatinine is 1.33 (1.42 yesterday). Unclear baseline but patient has a history of CKD IIIA. Patient is afebrile but with leukocytosis (15.1). Patient given NS bolus.   Assessment and Plan: * Intractable nausea and vomiting -- appreciate GI consult for management assistance  -- continue metoclopramide 5 mg IV TID  -- symptoms improving and GI team advancing diet   Leukocytosis -- monitor CBC/diff  Hypokalemia -- K repleted, Mg repleted at 2.2   Chronic back pain -- continue to take home oral meds as able   Diverticulosis -- CT reviewed, no evidence of diverticulitis   GERD (gastroesophageal reflux disease) -- continue IV protonix   Essential  hypertension -- BPs well controlled, following, restarted home lisinopril 10 mg daily   Anemia in chronic kidney disease (CKD) -- follow Hg with hydration, now 11.1  CKD (chronic kidney disease) stage 3A -- continue to monitor renal function -- renal function has improved with IV fluid hydration   Cellulitis of left elbow -- unable to take oral meds, continue IV ceftriaxone as ordered  -- WBC spiked today with concern for bursitis vs abscess on exam today -- check MRI L elbow on 10/30 when available    DVT prophylaxis: enoxaparin Code Status: full  Family Communication:  Disposition: Status is: Observation The patient remains OBS appropriate and will d/c before 2 midnights.   Consultants:  GI  Procedures:   Antimicrobials:    Subjective: Pt reports night sweats.    Objective: Vitals:   05/01/22 0636 05/01/22 1335 05/01/22 2055 05/02/22 0449  BP: (!) 142/86 132/83 127/80 129/73  Pulse: 65 74 73 71  Resp: 18     Temp: 98.4 F (36.9 C) 98.6 F (37 C) 99.3 F (37.4 C) 98.6 F (37 C)  TempSrc: Oral     SpO2: 99% 98% 94% 96%  Weight:      Height:        Intake/Output Summary (Last 24 hours) at 05/02/2022 1307 Last data filed at 05/02/2022 0500 Gross per 24 hour  Intake 1541.16 ml  Output --  Net 1541.16 ml   Filed Weights   04/30/22 1009 04/30/22 1848  Weight: 99.8 kg 90.9 kg   Examination:  General exam: Appears calm and comfortable  Respiratory system: Clear to auscultation. Respiratory effort normal. Cardiovascular system:  normal S1 & S2 heard. No JVD, murmurs, rubs, gallops or clicks. No pedal edema. Gastrointestinal system: Abdomen is nondistended, soft and nontender. No organomegaly or masses felt. Normal bowel sounds heard. Central nervous system: Alert and oriented. No focal neurological deficits. Extremities: Symmetric 5 x 5 power. Skin: No rashes, lesions or ulcers. Psychiatry: Judgement and insight appear normal. Mood & affect appropriate.    Data Reviewed: I have personally reviewed following labs and imaging studies  CBC: Recent Labs  Lab 04/29/22 1204 04/30/22 1056 05/01/22 0551 05/02/22 0408  WBC 12.8* 15.1* 12.7* 13.5*  NEUTROABS 11.6* 13.2*  --   --   HGB 12.5* 11.9* 10.2* 11.1*  HCT 35.9* 33.6* 29.8* 32.1*  MCV 88.9 89.6 90.6 90.9  PLT 297 294 294 344    Basic Metabolic Panel: Recent Labs  Lab 04/29/22 1204 04/30/22 1056 05/01/22 0551 05/02/22 0408  NA 138 138 139 138  K 3.7 3.5 3.2* 3.7  CL 105 107 111 109  CO2 22 22 20* 20*  GLUCOSE 156* 129* 109* 100*  BUN 28* 27* 28* 19  CREATININE 1.42* 1.33* 1.27* 1.18  CALCIUM 9.6 8.9 8.8* 8.9  MG  --   --  2.2  --     CBG: No results for input(s): "GLUCAP" in the last 168 hours.  Recent Results (from the past 240 hour(s))  Aerobic Culture w Gram Stain (superficial specimen)     Status: None (Preliminary result)   Collection Time: 04/29/22  2:45 PM   Specimen: Wound  Result Value Ref Range Status   Specimen Description   Final    WOUND Performed at [facility], [address], [place], [place] [address]    Special Requests   Final    NONE Performed at [facility], [address] [place], [place] [address]    Gram Stain   Final    RARE WBC PRESENT, PREDOMINANTLY MONONUCLEAR MODERATE GRAM POSITIVE COCCI IN PAIRS FEW GRAM NEGATIVE RODS    Culture   Final    MODERATE GROUP A STREP (S.PYOGENES) ISOLATED Beta hemolytic streptococci are predictably susceptible to penicillin and other beta lactams. Susceptibility testing not routinely performed. Performed at [facility], [address], [place], [place] [address]    Report Status PENDING  Incomplete  Resp Panel by RT-PCR (Flu A&B, Covid) Anterior Nasal Swab     Status: None   Collection Time: 04/30/22 10:57 AM   Specimen: Anterior Nasal Swab  Result Value Ref Range Status   SARS Coronavirus 2 by RT PCR NEGATIVE NEGATIVE Final    Comment: (NOTE) SARS-CoV-2 target nucleic acids are NOT  DETECTED.  The SARS-CoV-2 RNA is generally detectable in upper respiratory specimens during the acute phase of infection. The lowest concentration of SARS-CoV-2 viral copies this assay can detect is 138 copies/mL. A negative result does not preclude SARS-Cov-2 infection and should not be used as the sole basis for treatment or other patient management decisions. A negative result may occur with  improper specimen collection/handling, submission of specimen other than nasopharyngeal swab, presence of viral mutation(s) within the areas targeted by this assay, and inadequate number of viral copies(<138 copies/mL). A negative result must be combined with clinical observations, patient history, and epidemiological information. The expected result is Negative.  Fact Sheet for Patients:  BloggerCourse.com  Fact Sheet for Healthcare Providers:  SeriousBroker.it  This test is no t yet approved or cleared by the [place] FDA and  has been authorized for detection and/or diagnosis of  SARS-CoV-2 by FDA under an Emergency Use Authorization (EUA). This EUA will remain  in effect (meaning this test can be used) for the duration of the COVID-19 declaration under Section 564(b)(1) of the Act, 21 U.S.C.section 360bbb-3(b)(1), unless the authorization is terminated  or revoked sooner.       Influenza A by PCR NEGATIVE NEGATIVE Final   Influenza B by PCR NEGATIVE NEGATIVE Final    Comment: (NOTE) The Xpert Xpress SARS-CoV-2/FLU/RSV plus assay is intended as an aid in the diagnosis of influenza from Nasopharyngeal swab specimens and should not be used as a sole basis for treatment. Nasal washings and aspirates are unacceptable for Xpert Xpress SARS-CoV-2/FLU/RSV testing.  Fact Sheet for Patients: BloggerCourse.com  Fact Sheet for Healthcare Providers: SeriousBroker.it  This test is not yet  approved or cleared by the [place] FDA and has been authorized for detection and/or diagnosis of SARS-CoV-2 by FDA under an Emergency Use Authorization (EUA). This EUA will remain in effect (meaning this test can be used) for the duration of the COVID-19 declaration under Section 564(b)(1) of the Act, 21 U.S.C. section 360bbb-3(b)(1), unless the authorization is terminated or revoked.  Performed at [facility], [address] [place], [place] [address]   Culture, blood (Routine X 2) w Reflex to ID Panel     Status: None (Preliminary result)   Collection Time: 04/30/22  3:20 PM   Specimen: BLOOD RIGHT HAND  Result Value Ref Range Status   Specimen Description BLOOD RIGHT HAND  Final   Special Requests   Final    BOTTLES DRAWN AEROBIC AND ANAEROBIC Blood Culture results may not be optimal due to an excessive volume of blood received in culture bottles   Culture   Final    NO GROWTH 2 DAYS Performed at [facility], [address], [place], [place] [address]    Report Status PENDING  Incomplete  Culture, blood (Routine X 2) w Reflex to ID Panel     Status: None (Preliminary result)   Collection Time: 04/30/22  3:32 PM   Specimen: BLOOD LEFT HAND  Result Value Ref Range Status   Specimen Description BLOOD LEFT HAND  Final   Special Requests   Final    BOTTLES DRAWN AEROBIC AND ANAEROBIC Blood Culture results may not be optimal due to an excessive volume of blood received in culture bottles   Culture   Final    NO GROWTH 2 DAYS Performed at [facility], [address], [place], [place] [address]    Report Status PENDING  Incomplete     Radiology Studies: CT ABDOMEN PELVIS W CONTRAST  Result Date: 04/30/2022 CLINICAL DATA:  Fever, emesis, abdominal pain. EXAM: CT ABDOMEN AND PELVIS WITH CONTRAST TECHNIQUE: Multidetector CT imaging of the abdomen and pelvis was performed using the standard protocol following bolus administration of intravenous contrast. RADIATION DOSE  REDUCTION: This exam was performed according to the departmental dose-optimization program which includes automated exposure control, adjustment of the mA and/or kV according to patient size and/or use of iterative reconstruction technique. CONTRAST:  OMNIPAQUE IOHEXOL 300 MG/ML  SOLN COMPARISON:  CT abdomen/pelvis 05/30/2020 FINDINGS: Lower chest: There is atelectasis/scarring in the lung bases. There is no pleural effusion. The imaged heart is unremarkable. Hepatobiliary: The liver and gallbladder are unremarkable. There is no biliary ductal dilatation. Pancreas: Unremarkable. Spleen: Unremarkable. Adrenals/Urinary Tract: The 1.4 cm right adrenal nodule has been present since 2018, consistent with a benign adenoma requiring no specific imaging follow-up. The left adrenal is  unremarkable. Hypodense lesions in the left kidney likely reflect benign cysts for which no specific imaging follow-up is required, unchanged. There are no suspicious parenchymal lesions. There are no stones in either kidney or along the course of either ureter. There is no hydronephrosis or hydroureter. The bladder is unremarkable. There is symmetric excretion of contrast into the collecting systems on the delayed images. Stomach/Bowel: The stomach is unremarkable. There is no evidence of bowel obstruction. There is no abnormal bowel wall thickening or inflammatory change. The appendix is normal. There is colonic diverticulosis without evidence of acute diverticulitis. Vascular/Lymphatic: The abdominal aorta is normal in course and caliber. The major branch vessels are patent. The main portal and splenic veins are patent. There is no abdominal or pelvic lymphadenopathy. Reproductive: The prostate and seminal vesicles are unremarkable. Other: There is no ascites or free air. Musculoskeletal: Postsurgical changes reflecting L5-S1 fusion are again noted. There is no acute osseous abnormality or suspicious osseous lesion. IMPRESSION: 1. No  acute finding in the abdomen or pelvis. 2. Diverticulosis without evidence of acute diverticulitis. 3. Stable right adrenal adenoma requiring no specific imaging follow-up. Electronically Signed   By: Valetta Mole M.D.   On: 04/30/2022 14:01    Scheduled Meds:  baclofen  10 mg Oral TID   enoxaparin (LOVENOX) injection  40 mg Subcutaneous Q24H   guaiFENesin  600 mg Oral BID   lisinopril  10 mg Oral Daily   metoCLOPramide  5 mg Oral TID AC & HS   ondansetron  4 mg Oral Q6H   Or   ondansetron (ZOFRAN) IV  4 mg Intravenous Q6H   pantoprazole (PROTONIX) IV  40 mg Intravenous Q24H   Continuous Infusions:  cefTRIAXone (ROCEPHIN)  IV 50 mL/hr at 05/01/22 1457   lactated ringers 50 mL/hr at 05/02/22 0826     LOS: 1 day   Time spent: 35 mins  Zyir Gassert Wynetta Emery, MD How to contact the [facility] Attending or Consulting provider [place] or covering provider during after hours [place], for this patient?  Check the care team in [facility] and look for a) attending/consulting [facility] provider listed and b) the [facility] team listed Log into [email or web address] and use [place]'s universal password to access. If you do not have the password, please contact the hospital operator. Locate the [facility] provider you are looking for under [facility] and page to a number that you can be directly reached. If you still have difficulty reaching the provider, please page the [facility] (Director on Call) for the Hospitalists listed on amion for assistance.  05/02/2022, 1:07 PM

## 2022-05-03 ENCOUNTER — Inpatient Hospital Stay (HOSPITAL_COMMUNITY): Payer: No Typology Code available for payment source

## 2022-05-03 DIAGNOSIS — I10 Essential (primary) hypertension: Secondary | ICD-10-CM | POA: Diagnosis not present

## 2022-05-03 DIAGNOSIS — K5790 Diverticulosis of intestine, part unspecified, without perforation or abscess without bleeding: Secondary | ICD-10-CM | POA: Diagnosis not present

## 2022-05-03 DIAGNOSIS — D72829 Elevated white blood cell count, unspecified: Secondary | ICD-10-CM

## 2022-05-03 DIAGNOSIS — R112 Nausea with vomiting, unspecified: Secondary | ICD-10-CM | POA: Diagnosis not present

## 2022-05-03 DIAGNOSIS — L03114 Cellulitis of left upper limb: Secondary | ICD-10-CM | POA: Diagnosis not present

## 2022-05-03 LAB — BASIC METABOLIC PANEL
Anion gap: 9 (ref 5–15)
BUN: 14 mg/dL (ref 8–23)
CO2: 21 mmol/L — ABNORMAL LOW (ref 22–32)
Calcium: 8.7 mg/dL — ABNORMAL LOW (ref 8.9–10.3)
Chloride: 109 mmol/L (ref 98–111)
Creatinine, Ser: 1.16 mg/dL (ref 0.61–1.24)
GFR, Estimated: >60 mL/min (ref >60)
Glucose, Bld: 105 mg/dL — ABNORMAL HIGH (ref 70–99)
Potassium: 3.6 mmol/L (ref 3.5–5.1)
Sodium: 139 mmol/L (ref 135–145)

## 2022-05-03 LAB — CBC WITH DIFFERENTIAL/PLATELET
Abs Immature Granulocytes: 0.04 10*3/uL (ref 0.00–0.07)
Basophils Absolute: 0 10*3/uL (ref 0.0–0.1)
Basophils Relative: 0 %
Eosinophils Absolute: 0.1 10*3/uL (ref 0.0–0.5)
Eosinophils Relative: 2 %
HCT: 30.2 % — ABNORMAL LOW (ref 39.0–52.0)
Hemoglobin: 10.4 g/dL — ABNORMAL LOW (ref 13.0–17.0)
Immature Granulocytes: 0 %
Lymphocytes Relative: 19 %
Lymphs Abs: 1.8 10*3/uL (ref 0.7–4.0)
MCH: 30.8 pg (ref 26.0–34.0)
MCHC: 34.4 g/dL (ref 30.0–36.0)
MCV: 89.3 fL (ref 80.0–100.0)
Monocytes Absolute: 1 10*3/uL (ref 0.1–1.0)
Monocytes Relative: 11 %
Neutro Abs: 6.2 10*3/uL (ref 1.7–7.7)
Neutrophils Relative %: 68 %
Platelets: 337 10*3/uL (ref 150–400)
RBC: 3.38 MIL/uL — ABNORMAL LOW (ref 4.22–5.81)
RDW: 13.1 % (ref 11.5–15.5)
WBC: 9.2 10*3/uL (ref 4.0–10.5)
nRBC: 0 % (ref 0.0–0.2)

## 2022-05-03 MED ORDER — METOCLOPRAMIDE HCL 5 MG PO TABS: 5.0000 mg | ORAL_TABLET | Freq: Three times a day (TID) | ORAL | 0 refills | Status: AC | PRN

## 2022-05-03 MED ORDER — LORAZEPAM 2 MG/ML IJ SOLN
1.0000 mg | Freq: Once | INTRAMUSCULAR | Status: AC | PRN
  Administered 2022-05-03: 1 mg via INTRAVENOUS
  Filled 2022-05-03: qty 1

## 2022-05-03 MED ORDER — SILVER SULFADIAZINE 1 % EX CREA: TOPICAL_CREAM | CUTANEOUS | 1 refills | Status: AC

## 2022-05-03 MED ORDER — ONDANSETRON 4 MG PO TBDP: ORAL_TABLET | ORAL | 0 refills | Status: AC

## 2022-05-03 NOTE — Progress Notes (Addendum)
Patient discharged with instructions given on medications and follow up visits,patient verbalized understanding.Prescription sent to Pharmacy of choice documented on AVS. IV discontinued, catheter intact.Accompanied by staff to an awaiting vehicle.

## 2022-05-03 NOTE — Discharge Summary (Addendum)
Physician Discharge Summary  Brooke Payes ZOX:[id number] DOB: 07/02/1961 DOA: 04/30/2022  PCP: [facility]  Admit date: 04/30/2022 Discharge date: 05/03/2022  Admitted From:  Home  Disposition: Home   Recommendations for Outpatient Follow-up:  Follow up with PCP in 1 week Follow up with [facility] in 2 weeks Please obtain BMP/CBC in one week Please follow up on the following pending results: final culture results  Discharge Condition: Stable  CODE STATUS: Full  DIET: resume prior home diet   Brief Hospitalization Summary: Please see all hospital notes, images, labs for full details of the hospitalization.  61 year old male with a history of recent COVID infection (tested positive two weeks ago), diverticulitis, hypertension, chronic back pain with radiculopathy, and GERD who presents to the ED with intractable nausea and vomiting. Patient was discharged from the ED yesterday with vomiting and left elbow pain secondary to a burn he received from falling asleep on a heating pad. Patient was discharged yesterday with oral Keflex (given concern for possible cellulitis) and Phenergan. Patient was unable to take medicine due to vomiting.  Patient states that since testing positive for COVID two week ago, he has been unable to keep food/liquid down. He has lost 12 pounds during this time. He denies abdominal pain, diarrhea, chest pain, or dyspnea. He expresses concern that he "really needs to eat" and also with withdrawal concerns because he has not been able to take his Lyrica or Baclofen for 2+ days.    ED Course: Chest X-ray and CT abdomen unremarkable. Negative for Flu/COVID. U/A unremarkable aside from mild proteinuria (30). Creatinine is 1.33 (1.42 yesterday). Unclear baseline but patient has a history of CKD IIIA. Patient is afebrile but with leukocytosis (15.1). Patient given NS bolus.  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 Patient has improved and will be discharged with Reglan PRN.   Leukocytosis - RESOLVED  Etiology unclear but most likely a result of elbow cellulitis. WBC count normal as of today. Patient discharged with Keflex.  Hypokalemia - TREATED AND RESOLVED  -Resolved -K repleted, Mg repleted at 2.2    Chronic back pain -- continue to take home oral meds as able    Diverticulosis -- CT reviewed, no evidence of diverticulitis    GERD (gastroesophageal reflux disease) -- continue  protonix    Essential hypertension -- BPs well controlled. Continue home Lisinopril.   Anemia in chronic kidney disease (CKD) --Currently 10.4. Follow-up with PCP.   CKD (chronic kidney disease) stage 3A -- renal function has improved with IV fluid hydration. Creatinine is 1.16. Follow-up with PCP.   Cellulitis of left elbow See MRI details below. No findings to suggest abscess and osteomyelitis. Patient will be discharged on Keflex with orthopedic referral.     Consultants:  GI  Procedures:    Antimicrobials:      Discharge Diagnoses:  Principal Problem:   Intractable nausea and vomiting Active Problems:   Cellulitis of left elbow   CKD (chronic kidney disease) stage 3A   Leukocytosis   Anemia in chronic kidney disease (CKD)   Essential hypertension   GERD (gastroesophageal reflux disease)   Diverticulosis   Chronic back pain   Hypokalemia   Discharge Instructions: Discharge Instructions     Ambulatory referral to Orthopedic Surgery   Complete by: As directed    Hospital Follow up for left elbow     IMPORTANT INFORMATION: PAY CLOSE  ATTENTION   PHYSICIAN DISCHARGE INSTRUCTIONS  Follow with Primary care provider  [facility]  and other consultants as instructed by your Hospitalist Physician  SEEK MEDICAL CARE OR RETURN TO EMERGENCY ROOM IF SYMPTOMS COME BACK, WORSEN OR NEW PROBLEM DEVELOPS    Please note: You were cared for by a hospitalist during your hospital stay. Every effort will be made to forward records to your primary care provider.  You can request that your primary care provider send for your hospital records if they have not received them.  Once you are discharged, your primary care physician will handle any further medical issues. Please note that NO REFILLS for any discharge medications will be authorized once you are discharged, as it is imperative that you return to your primary care physician (or establish a relationship with a primary care physician if you do not have one) for your post hospital discharge needs so that they can reassess your need for medications and monitor your lab values.  Please get a complete blood count and chemistry panel checked by your Primary MD at your next visit, and again as instructed by your Primary MD.  Get Medicines reviewed and adjusted: Please take all your medications with you for your next visit with your Primary MD  Laboratory/radiological data: Please request your Primary MD to go over all hospital tests and procedure/radiological results at the follow up, please ask your primary care provider to get all Hospital records sent to his/her office.  In some cases, they will be blood work, cultures and biopsy results pending at the time of your discharge. Please request that your primary care provider follow up on these results.  If you are diabetic, please bring your blood sugar readings with you to your follow up appointment with primary care.    Please call and make your follow up appointments as soon as possible.    Also Note the following: If you experience worsening of your admission symptoms, develop shortness of breath, life threatening emergency, suicidal or homicidal thoughts you must seek medical attention immediately by calling 911 or calling your MD immediately  if symptoms less severe.  You must read complete  instructions/literature along with all the possible adverse reactions/side effects for all the Medicines you take and that have been prescribed to you. Take any new Medicines after you have completely understood and accpet all the possible adverse reactions/side effects.   Do not drive when taking Pain medications or sleeping medications (Benzodiazepines)  Do not take more than prescribed Pain, Sleep and Anxiety Medications. It is not advisable to combine anxiety,sleep and pain medications without talking with your primary care practitioner  Special Instructions: If you have smoked or chewed Tobacco  in the last 2 yrs please stop smoking, stop any regular Alcohol  and or any Recreational drug use.  Wear Seat belts while driving.  Do not drive if taking any narcotic, mind altering or controlled substances or recreational drugs or alcohol.       Allergies as of 05/03/2022       Reactions   Nsaids Other (See Comments)   GI Upset   Sulfa Antibiotics Rash        Medication List     STOP taking these medications    HYDROcodone-acetaminophen 5-325 MG tablet Commonly known as: NORCO/VICODIN   promethazine 25 MG tablet Commonly known as: PHENERGAN       TAKE these medications    baclofen 20 MG tablet Commonly known as: LIORESAL Take  10-20 mg by mouth in the morning, at noon, and at bedtime.  morning and evening and  at noon   cephALEXin 500 MG capsule Commonly known as: KEFLEX Take 1 capsule (500 mg total) by mouth 4 (four) times daily.   cholecalciferol 25 MCG (1000 UNIT) tablet Commonly known as: VITAMIN D3 Take 1,000 Units by mouth daily.   hydrochlorothiazide 12.5 MG capsule Commonly known as: MICROZIDE Take 12.5 mg by mouth daily.   lisinopril 10 MG tablet Commonly known as: ZESTRIL Take 10 mg by mouth daily.   metoCLOPramide 5 MG tablet Commonly known as: REGLAN Take 1 tablet (5 mg total) by mouth every 8 (eight) hours as needed for up to 3 days for  refractory nausea / vomiting.   ondansetron 4 MG disintegrating tablet Commonly known as: Zofran ODT  ODT q4 hours prn nausea/vomit   oxyCODONE 15 MG immediate release tablet Commonly known as: ROXICODONE Take 15 mg by mouth every 6 (six) hours.   pantoprazole 40 MG tablet Commonly known as: PROTONIX Take 40 mg by mouth 2 (two) times daily.   pregabalin 75 MG capsule Commonly known as: LYRICA Take 150 mg by mouth in the morning and at bedtime.   saccharomyces boulardii 250 MG capsule Commonly known as: FLORASTOR Take 250 mg by mouth 2 (two) times daily.   VITAMIN B-12 PO Take 1 tablet by mouth daily.        Follow-up Information     [facility] in 1 week(s).   Why: Hospital Follow Up Contact information: [address] [facility] [place] [address] [phone]         [facility]. Schedule an appointment as soon as possible for a visit in 2 week(s).   Specialty: Orthopedics Why: Hospital Follow Up Contact information: [address] [place] [address] [phone]               Allergies  Allergen Reactions   Nsaids Other (See Comments)    GI Upset   Sulfa Antibiotics Rash   Allergies as of 05/03/2022       Reactions   Nsaids Other (See Comments)   GI Upset   Sulfa Antibiotics Rash        Medication List     STOP taking these medications    HYDROcodone-acetaminophen 5-325 MG tablet Commonly known as: NORCO/VICODIN   promethazine 25 MG tablet Commonly known as: PHENERGAN       TAKE these medications    baclofen 20 MG tablet Commonly known as: LIORESAL Take 10-20 mg by mouth in the morning, at noon, and at bedtime.  morning and evening and  at noon   cephALEXin 500 MG capsule Commonly known as: KEFLEX Take 1 capsule (500 mg total) by mouth 4 (four) times daily.   cholecalciferol 25 MCG (1000 UNIT) tablet Commonly known as: VITAMIN  D3 Take 1,000 Units by mouth daily.   hydrochlorothiazide 12.5 MG capsule Commonly known as: MICROZIDE Take 12.5 mg by mouth daily.   lisinopril 10 MG tablet Commonly known as: ZESTRIL Take 10 mg by mouth daily.   metoCLOPramide 5 MG tablet Commonly known as: REGLAN Take 1 tablet (5 mg total) by mouth every 8 (eight) hours as needed for up to 3 days for refractory nausea / vomiting.   ondansetron 4 MG disintegrating tablet Commonly known as: Zofran ODT  ODT q4 hours prn nausea/vomit   oxyCODONE 15 MG immediate release tablet Commonly known  as: ROXICODONE Take 15 mg by mouth every 6 (six) hours.   pantoprazole 40 MG tablet Commonly known as: PROTONIX Take 40 mg by mouth 2 (two) times daily.   pregabalin 75 MG capsule Commonly known as: LYRICA Take 150 mg by mouth in the morning and at bedtime.   saccharomyces boulardii 250 MG capsule Commonly known as: FLORASTOR Take 250 mg by mouth 2 (two) times daily.   VITAMIN B-12 PO Take 1 tablet by mouth daily.        Procedures/Studies: MR ELBOW LEFT WO CONTRAST  Result Date: 05/03/2022 CLINICAL DATA:  Superficial left elbow soft tissue mass. Open wound of posterior elbow. EXAM: MRI OF THE LEFT ELBOW WITHOUT CONTRAST TECHNIQUE: Multiplanar, multisequence MR imaging of the elbow was performed. No intravenous contrast was administered. The technologist reports this was a very limited exam due to patient condition and ability to cooperate. Only axial and coronal T1-weighted images were able to be performed. COMPARISON:  Left elbow radiographs 04/29/2022 FINDINGS: Very limited study with only two sequences performed. Additionally, there is mild-to-moderate motion artifact that further limits evaluation. TENDONS Very limited evaluation of the common flexor and extensor tendons, biceps tendon insertion, brachialis tendon insertion, and triceps tendon insertion without a fluid sensitive sequence performed. No full-thickness tendon tear  is seen. LIGAMENTS Medial stabilizers: Very limited evaluation of the ulnar collateral ligament, radial collateral ligament, and radioulnar collateral ligament in the absence of a fluid sensitive sequence. No gross full-thickness tear is seen. Cartilage: Limited evaluation on only T1 weighted images. Joint: Moderate medial and lateral elbow osteoarthritis, including joint space narrowing at the medial aspect of the trochlea-coronoid process at the medial elbow and joint space narrowing at the radiocapitellar joint. Mild-to-moderate radial head-neck junction peripheral degenerative osteophytes. No definite joint effusion. Cubital tunnel: No gross abnormality is seen within the ulnar nerve on the provided T1 weighted images. No gross abnormality is seen within the median or radial nerves on limited T1 weighted images. Bones: On recent radiographs there is soft tissue swelling of the posterior elbow with the reported wound. There appears to be skin thinning of the posterior elbow at the level of the olecranon (axial series 12, image 18). On limited T1 weighted images, no definite soft tissue ulceration is seen contacting bone, however the decreased T1 signal within the posterior elbow subcutaneous fat is nonspecific and may represent subcutaneous fat edema or fluid that could contact bone. The lack of T2 weighted sequences is markedly limiting. No definitive cortical erosion to indicate osteomyelitis. IMPRESSION: 1. Very limited study with only two T1-weighted sequences performed due to patient condition. No fluid-sensitive sequences were able to be performed. 2. There is skin thinning of the posterior elbow at the level of the olecranon. On limited T1 weighted images, no definite soft tissue ulceration is seen contacting bone, however in the absence of T2 weighted imaging, the decreased T1 signal within the posterior elbow subcutaneous fat is nonspecific and may represent subcutaneous fat edema or fluid that could  contact bone. No definitive cortical erosion to indicate osteomyelitis. 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 Electronically Signed   By: Neita Garnet M.D.   On: 05/03/2022 11:54   CT ABDOMEN PELVIS W CONTRAST  Result Date: 04/30/2022 CLINICAL DATA:  Fever, emesis, abdominal pain. EXAM: CT ABDOMEN AND PELVIS WITH CONTRAST TECHNIQUE: Multidetector CT imaging of the abdomen and pelvis was performed using the standard protocol following bolus administration of intravenous contrast. RADIATION DOSE REDUCTION: This exam was performed according to the  departmental dose-optimization program which includes automated exposure control, adjustment of the mA and/or kV according to patient size and/or use of iterative reconstruction technique. CONTRAST:  OMNIPAQUE IOHEXOL 300 MG/ML  SOLN COMPARISON:  CT abdomen/pelvis 05/30/2020 FINDINGS: Lower chest: There is atelectasis/scarring in the lung bases. There is no pleural effusion. The imaged heart is unremarkable. Hepatobiliary: The liver and gallbladder are unremarkable. There is no biliary ductal dilatation. Pancreas: Unremarkable. Spleen: Unremarkable. Adrenals/Urinary Tract: The 1.4 cm right adrenal nodule has been present since 2018, consistent with a benign adenoma requiring no specific imaging follow-up. The left adrenal is unremarkable. Hypodense lesions in the left kidney likely reflect benign cysts for which no specific imaging follow-up is required, unchanged. There are no suspicious parenchymal lesions. There are no stones in either kidney or along the course of either ureter. There is no hydronephrosis or hydroureter. The bladder is unremarkable. There is symmetric excretion of contrast into the collecting systems on the delayed images. Stomach/Bowel: The stomach is unremarkable. There is no evidence of bowel obstruction. There is no abnormal bowel wall thickening or inflammatory change. The appendix is normal. There is colonic diverticulosis  without evidence of acute diverticulitis. Vascular/Lymphatic: The abdominal aorta is normal in course and caliber. The major branch vessels are patent. The main portal and splenic veins are patent. There is no abdominal or pelvic lymphadenopathy. Reproductive: The prostate and seminal vesicles are unremarkable. Other: There is no ascites or free air. Musculoskeletal: Postsurgical changes reflecting L5-S1 fusion are again noted. There is no acute osseous abnormality or suspicious osseous lesion. IMPRESSION: 1. No acute finding in the abdomen or pelvis. 2. Diverticulosis without evidence of acute diverticulitis. 3. Stable right adrenal adenoma requiring no specific imaging follow-up. Electronically Signed   By: Lesia Hausen M.D.   On: 04/30/2022 14:01   DG Chest Port 1 View  Result Date: 04/30/2022 CLINICAL DATA:  Shortness of breath EXAM: PORTABLE CHEST 1 VIEW COMPARISON:  Chest radiograph dated 04/29/2022. FINDINGS: The heart size and mediastinal contours are within normal limits. Both lungs are clear. The visualized skeletal structures are unremarkable. IMPRESSION: No active disease. Electronically Signed   By: Romona Curls M.D.   On: 04/30/2022 10:54   DG Elbow Complete Left  Result Date: 04/29/2022 CLINICAL DATA:  Burned elbow 1 week ago by leaving the hot pad on too long. Now with pain, redness and swelling. EXAM: LEFT ELBOW - COMPLETE 3+ VIEW COMPARISON:  None Available. FINDINGS: There is mild scratch set diffuse soft tissue edema/swelling is noted overlying the left elbow. This is most notable along the posterior aspect of the elbow. There is no underlying fracture or dislocation. IMPRESSION: 1. Soft tissue edema/swelling. Most notable along the posterior aspect of the elbow. Correlate for any clinical signs/symptoms of cellulitis or olecranon bursitis. 2. No acute bone abnormality. Electronically Signed   By: Signa Kell M.D.   On: 04/29/2022 12:39   DG Chest 2 View  Result Date:  04/29/2022 CLINICAL DATA:  Cough and fever EXAM: CHEST - 2 VIEW COMPARISON:  09/04/2021 FINDINGS: The heart size and mediastinal contours are within normal limits. Both lungs are clear. The visualized skeletal structures are unremarkable. IMPRESSION: No active cardiopulmonary disease. Electronically Signed   By: Marlan Palau M.D.   On: 04/29/2022 12:37     Subjective: Patient feels well overall and is ready to go home.  He is tolerating diet well.  No emesis.  No nausea.    Discharge Exam: Vitals:   05/03/22 1610 05/03/22 9604  BP: 131/87 (!) 140/88  Pulse: 69 76  Resp: 14   Temp: 98.4 F (36.9 C)   SpO2: 98%    Vitals:   05/02/22 1407 05/02/22 2011 05/03/22 0332 05/03/22 0905  BP: (!) 142/91 131/87 131/87 (!) 140/88  Pulse: 71 70 69 76  Resp: 17 14 14    Temp: 98.7 F (37.1 C) 99.1 F (37.3 C) 98.4 F (36.9 C)   TempSrc:      SpO2: 99% 98% 98%   Weight:      Height:        General: Pt is alert, awake, not in acute distress Cardiovascular: RRR, S1/S2 +, no rubs, no gallops Respiratory: CTA bilaterally, no wheezing, no rhonchi Abdominal: Soft, NT, ND, bowel sounds + Extremities: left elbow with erythema that is improved, no fluctuance, good range of motion.  no cyanosis   The results of significant diagnostics from this hospitalization (including imaging, microbiology, ancillary and laboratory) are listed below for reference.     Microbiology: Recent Results (from the past 240 hour(s))  Aerobic Culture w Gram Stain (superficial specimen)     Status: None (Preliminary result)   Collection Time: 04/29/22  2:45 PM   Specimen: Wound  Result Value Ref Range Status   Specimen Description   Final    WOUND Performed at [facility], [address] [place], [place] [address]    Special Requests   Final    NONE Performed at [facility], [address] [place], [place] [address]    Gram Stain   Final    RARE WBC PRESENT, PREDOMINANTLY MONONUCLEAR MODERATE GRAM POSITIVE  COCCI IN PAIRS FEW GRAM NEGATIVE RODS    Culture   Final    MODERATE GROUP A STREP (S.PYOGENES) ISOLATED Beta hemolytic streptococci are predictably susceptible to penicillin and other beta lactams. Susceptibility testing not routinely performed. Performed at [facility], [address] [place], [place] [address]    Report Status PENDING  Incomplete  Resp Panel by RT-PCR (Flu A&B, Covid) Anterior Nasal Swab     Status: None   Collection Time: 04/30/22 10:57 AM   Specimen: Anterior Nasal Swab  Result Value Ref Range Status   SARS Coronavirus 2 by RT PCR NEGATIVE NEGATIVE Final    Comment: (NOTE) SARS-CoV-2 target nucleic acids are NOT DETECTED.  The SARS-CoV-2 RNA is generally detectable in upper respiratory specimens during the acute phase of infection. The lowest concentration of SARS-CoV-2 viral copies this assay can detect is 138 copies/mL. A negative result does not preclude SARS-Cov-2 infection and should not be used as the sole basis for treatment or other patient management decisions. A negative result may occur with  improper specimen collection/handling, submission of specimen other than nasopharyngeal swab, presence of viral mutation(s) within the areas targeted by this assay, and inadequate number of viral copies(<138 copies/mL). A negative result must be combined with clinical observations, patient history, and epidemiological information. The expected result is Negative.  Fact Sheet for Patients:  BloggerCourse.comhttps://www.fda.gov/media/152166/download  Fact Sheet for Healthcare Providers:  SeriousBroker.ithttps://www.fda.gov/media/152162/download  This test is no t yet approved or cleared by the [place] FDA and  has been authorized for detection and/or diagnosis of SARS-CoV-2 by FDA under an Emergency Use Authorization (EUA). This EUA will remain  in effect (meaning this test can be used) for the duration of the COVID-19 declaration under Section 564(b)(1) of the Act,  21 U.S.C.section 360bbb-3(b)(1), unless the authorization is terminated  or revoked sooner.  Influenza A by PCR NEGATIVE NEGATIVE Final   Influenza B by PCR NEGATIVE NEGATIVE Final    Comment: (NOTE) The Xpert Xpress SARS-CoV-2/FLU/RSV plus assay is intended as an aid in the diagnosis of influenza from Nasopharyngeal swab specimens and should not be used as a sole basis for treatment. Nasal washings and aspirates are unacceptable for Xpert Xpress SARS-CoV-2/FLU/RSV testing.  Fact Sheet for Patients: BloggerCourse.com  Fact Sheet for Healthcare Providers: SeriousBroker.it  This test is not yet approved or cleared by the [place] FDA and has been authorized for detection and/or diagnosis of SARS-CoV-2 by FDA under an Emergency Use Authorization (EUA). This EUA will remain in effect (meaning this test can be used) for the duration of the COVID-19 declaration under Section 564(b)(1) of the Act, 21 U.S.C. section 360bbb-3(b)(1), unless the authorization is terminated or revoked.  Performed at [facility], [address] [place], [place] [address]   Culture, blood (Routine X 2) w Reflex to ID Panel     Status: None (Preliminary result)   Collection Time: 04/30/22  3:20 PM   Specimen: BLOOD RIGHT HAND  Result Value Ref Range Status   Specimen Description BLOOD RIGHT HAND  Final   Special Requests   Final    BOTTLES DRAWN AEROBIC AND ANAEROBIC Blood Culture results may not be optimal due to an excessive volume of blood received in culture bottles   Culture   Final    NO GROWTH 3 DAYS Performed at [facility], [address], [place], [place] [address]    Report Status PENDING  Incomplete  Culture, blood (Routine X 2) w Reflex to ID Panel     Status: None (Preliminary result)   Collection Time: 04/30/22  3:32 PM   Specimen: BLOOD LEFT HAND  Result Value Ref Range Status   Specimen Description BLOOD LEFT HAND  Final    Special Requests   Final    BOTTLES DRAWN AEROBIC AND ANAEROBIC Blood Culture results may not be optimal due to an excessive volume of blood received in culture bottles   Culture   Final    NO GROWTH 3 DAYS Performed at [facility], [address], [place], [place] [address]    Report Status PENDING  Incomplete     Labs: BNP (last 3 results) No results for input(s): "BNP" in the last 8760 hours. Basic Metabolic Panel: Recent Labs  Lab 04/29/22 1204 04/30/22 1056 05/01/22 0551 05/02/22 0408 05/03/22 0521  NA 138 138 139 138 139  K 3.7 3.5 3.2* 3.7 3.6  CL 105 107 111 109 109  CO2 22 22 20* 20* 21*  GLUCOSE 156* 129* 109* 100* 105*  BUN 28* 27* 28* 19 14  CREATININE 1.42* 1.33* 1.27* 1.18 1.16  CALCIUM 9.6 8.9 8.8* 8.9 8.7*  MG  --   --  2.2  --   --    Liver Function Tests: Recent Labs  Lab 04/29/22 1204 04/30/22 1056  AST 21 19  ALT 16 14  ALKPHOS 56 50  BILITOT 0.9 0.5  PROT 8.0 7.5  ALBUMIN 4.2 3.8   Recent Labs  Lab 04/29/22 1204  LIPASE 28   No results for input(s): "AMMONIA" in the last 168 hours. CBC: Recent Labs  Lab 04/29/22 1204 04/30/22 1056 05/01/22 0551 05/02/22 0408 05/03/22 0521  WBC 12.8* 15.1* 12.7* 13.5* 9.2  NEUTROABS 11.6* 13.2*  --   --  6.2  HGB 12.5* 11.9* 10.2* 11.1* 10.4*  HCT 35.9* 33.6* 29.8* 32.1* 30.2*  MCV  88.9 89.6 90.6 90.9 89.3  PLT 297 294 294 344 337   Cardiac Enzymes: No results for input(s): "CKTOTAL", "CKMB", "CKMBINDEX", "TROPONINI" in the last 168 hours. BNP: Invalid input(s): "POCBNP" CBG: No results for input(s): "GLUCAP" in the last 168 hours. D-Dimer No results for input(s): "DDIMER" in the last 72 hours. Hgb A1c No results for input(s): "HGBA1C" in the last 72 hours. Lipid Profile No results for input(s): "CHOL", "HDL", "LDLCALC", "TRIG", "CHOLHDL", "LDLDIRECT" in the last 72 hours. Thyroid function studies No results for input(s): "TSH", "T4TOTAL", "T3FREE", "THYROIDAB" in the last 72  hours.  Invalid input(s): "FREET3" Anemia work up No results for input(s): "VITAMINB12", "FOLATE", "FERRITIN", "TIBC", "IRON", "RETICCTPCT" in the last 72 hours. Urinalysis    Component Value Date/Time   COLORURINE YELLOW 04/30/2022 1133   APPEARANCEUR HAZY (A) 04/30/2022 1133   LABSPEC 1.026 04/30/2022 1133   PHURINE 5.0 04/30/2022 1133   GLUCOSEU NEGATIVE 04/30/2022 1133   HGBUR NEGATIVE 04/30/2022 1133   BILIRUBINUR NEGATIVE 04/30/2022 1133   BILIRUBINUR negative 11/26/2019 1721   KETONESUR NEGATIVE 04/30/2022 1133   PROTEINUR 30 (A) 04/30/2022 1133   UROBILINOGEN 0.2 11/26/2019 1721   NITRITE NEGATIVE 04/30/2022 1133   LEUKOCYTESUR NEGATIVE 04/30/2022 1133   Sepsis Labs Recent Labs  Lab 04/30/22 1056 05/01/22 0551 05/02/22 0408 05/03/22 0521  WBC 15.1* 12.7* 13.5* 9.2   Microbiology Recent Results (from the past 240 hour(s))  Aerobic Culture w Gram Stain (superficial specimen)     Status: None (Preliminary result)   Collection Time: 04/29/22  2:45 PM   Specimen: Wound  Result Value Ref Range Status   Specimen Description   Final    WOUND Performed at [facility], [address], [place], [place] [address]    Special Requests   Final    NONE Performed at [facility], [address] [place], [place] [address]    Gram Stain   Final    RARE WBC PRESENT, PREDOMINANTLY MONONUCLEAR MODERATE GRAM POSITIVE COCCI IN PAIRS FEW GRAM NEGATIVE RODS    Culture   Final    MODERATE GROUP A STREP (S.PYOGENES) ISOLATED Beta hemolytic streptococci are predictably susceptible to penicillin and other beta lactams. Susceptibility testing not routinely performed. Performed at [facility], [address], [place], [place] [address]    Report Status PENDING  Incomplete  Resp Panel by RT-PCR (Flu A&B, Covid) Anterior Nasal Swab     Status: None   Collection Time: 04/30/22 10:57 AM   Specimen: Anterior Nasal Swab  Result Value Ref Range Status   SARS Coronavirus 2 by RT  PCR NEGATIVE NEGATIVE Final    Comment: (NOTE) SARS-CoV-2 target nucleic acids are NOT DETECTED.  The SARS-CoV-2 RNA is generally detectable in upper respiratory specimens during the acute phase of infection. The lowest concentration of SARS-CoV-2 viral copies this assay can detect is 138 copies/mL. A negative result does not preclude SARS-Cov-2 infection and should not be used as the sole basis for treatment or other patient management decisions. A negative result may occur with  improper specimen collection/handling, submission of specimen other than nasopharyngeal swab, presence of viral mutation(s) within the areas targeted by this assay, and inadequate number of viral copies(<138 copies/mL). A negative result must be combined with clinical observations, patient history, and epidemiological information. The expected result is Negative.  Fact Sheet for Patients:  BloggerCourse.com  Fact Sheet for Healthcare Providers:  SeriousBroker.it  This test is no t yet approved or cleared by the [place] FDA  and  has been authorized for detection and/or diagnosis of SARS-CoV-2 by FDA under an Emergency Use Authorization (EUA). This EUA will remain  in effect (meaning this test can be used) for the duration of the COVID-19 declaration under Section 564(b)(1) of the Act, 21 U.S.C.section 360bbb-3(b)(1), unless the authorization is terminated  or revoked sooner.       Influenza A by PCR NEGATIVE NEGATIVE Final   Influenza B by PCR NEGATIVE NEGATIVE Final    Comment: (NOTE) The Xpert Xpress SARS-CoV-2/FLU/RSV plus assay is intended as an aid in the diagnosis of influenza from Nasopharyngeal swab specimens and should not be used as a sole basis for treatment. Nasal washings and aspirates are unacceptable for Xpert Xpress SARS-CoV-2/FLU/RSV testing.  Fact Sheet for Patients: EntrepreneurPulse.com.au  Fact Sheet for  Healthcare Providers: IncredibleEmployment.be  This test is not yet approved or cleared by the [place] FDA and has been authorized for detection and/or diagnosis of SARS-CoV-2 by FDA under an Emergency Use Authorization (EUA). This EUA will remain in effect (meaning this test can be used) for the duration of the COVID-19 declaration under Section 564(b)(1) of the Act, 21 U.S.C. section 360bbb-3(b)(1), unless the authorization is terminated or revoked.  Performed at [facility], [address], [place], [place] [address]   Culture, blood (Routine X 2) w Reflex to ID Panel     Status: None (Preliminary result)   Collection Time: 04/30/22  3:20 PM   Specimen: BLOOD RIGHT HAND  Result Value Ref Range Status   Specimen Description BLOOD RIGHT HAND  Final   Special Requests   Final    BOTTLES DRAWN AEROBIC AND ANAEROBIC Blood Culture results may not be optimal due to an excessive volume of blood received in culture bottles   Culture   Final    NO GROWTH 3 DAYS Performed at [facility], [address], [place], [place] [address]    Report Status PENDING  Incomplete  Culture, blood (Routine X 2) w Reflex to ID Panel     Status: None (Preliminary result)   Collection Time: 04/30/22  3:32 PM   Specimen: BLOOD LEFT HAND  Result Value Ref Range Status   Specimen Description BLOOD LEFT HAND  Final   Special Requests   Final    BOTTLES DRAWN AEROBIC AND ANAEROBIC Blood Culture results may not be optimal due to an excessive volume of blood received in culture bottles   Culture   Final    NO GROWTH 3 DAYS Performed at [facility] [address] [place],  [address]    Report Status PENDING  Incomplete   Time coordinating discharge:  38 mins  SIGNED: Irwin Brakeman, MD  Patient seen and examined with T. Dorene Ar, Careers information officer. In addition to supervising the encounter, I played a key role in the decision making process as well as reviewed key  findings.   Stanford Breed, Medical Student  [facility] 05/03/2022, 12:40 PM How to contact the [facility] Attending or Consulting provider [place] or covering provider during after hours [place], for this patient?  Check the care team in [facility] and look for a) attending/consulting [facility] provider listed and b) the [facility] team listed Log into [email or web address] and use [place]'s universal password to access. If you do not have the password, please contact the hospital operator. Locate the [facility] provider you are looking for under [facility] and page to a number that you can be directly reached. If you still have difficulty reaching  the provider, please page the [facility] (Director on Call) for the Hospitalists listed on amion for assistance.

## 2022-05-03 NOTE — Discharge Instructions (Signed)
IMPORTANT INFORMATION: PAY CLOSE ATTENTION   PHYSICIAN DISCHARGE INSTRUCTIONS  Follow with Primary care provider  Clinic, [facility]  and other consultants as instructed by your Hospitalist Physician  SEEK MEDICAL CARE OR RETURN TO EMERGENCY ROOM IF SYMPTOMS COME BACK, WORSEN OR NEW PROBLEM DEVELOPS   Please note: You were cared for by a hospitalist during your hospital stay. Every effort will be made to forward records to your primary care provider.  You can request that your primary care provider send for your hospital records if they have not received them.  Once you are discharged, your primary care physician will handle any further medical issues. Please note that NO REFILLS for any discharge medications will be authorized once you are discharged, as it is imperative that you return to your primary care physician (or establish a relationship with a primary care physician if you do not have one) for your post hospital discharge needs so that they can reassess your need for medications and monitor your lab values.  Please get a complete blood count and chemistry panel checked by your Primary MD at your next visit, and again as instructed by your Primary MD.  Get Medicines reviewed and adjusted: Please take all your medications with you for your next visit with your Primary MD  Laboratory/radiological data: Please request your Primary MD to go over all hospital tests and procedure/radiological results at the follow up, please ask your primary care provider to get all Hospital records sent to his/her office.  In some cases, they will be blood work, cultures and biopsy results pending at the time of your discharge. Please request that your primary care provider follow up on these results.  If you are diabetic, please bring your blood sugar readings with you to your follow up appointment with primary care.    Please call and make your follow up appointments as soon as possible.    Also  Note the following: If you experience worsening of your admission symptoms, develop shortness of breath, life threatening emergency, suicidal or homicidal thoughts you must seek medical attention immediately by calling 911 or calling your MD immediately  if symptoms less severe.  You must read complete instructions/literature along with all the possible adverse reactions/side effects for all the Medicines you take and that have been prescribed to you. Take any new Medicines after you have completely understood and accpet all the possible adverse reactions/side effects.   Do not drive when taking Pain medications or sleeping medications (Benzodiazepines)  Do not take more than prescribed Pain, Sleep and Anxiety Medications. It is not advisable to combine anxiety,sleep and pain medications without talking with your primary care practitioner  Special Instructions: If you have smoked or chewed Tobacco  in the last 2 yrs please stop smoking, stop any regular Alcohol  and or any Recreational drug use.  Wear Seat belts while driving.  Do not drive if taking any narcotic, mind altering or controlled substances or recreational drugs or alcohol.       

## 2022-05-03 NOTE — Progress Notes (Signed)
GI signing off. He will need to follow-up with his primary GI at the [place] .

## 2022-05-03 NOTE — Progress Notes (Signed)
Patient rested quietly through night, requested pain medication twice @ 11pm and 4am. Ambulated unassisted to bathroom twice through night and is now resting quietly with call bell in reach.

## 2022-05-04 NOTE — ED Provider Notes (Signed)
[facility] Provider Note   CSN: [id number] Arrival date & time: 04/30/22  9735     History  Chief Complaint  Patient presents with   Emesis    John Jordan is a 61 y.o. male.  Patient has a history of hypertension.  He has continued to vomit since the previous visit the day before.  He has a small cellulitis to his left elbow.  The history is provided by the patient and medical records. No language interpreter was used.  Emesis Severity:  Moderate Timing:  Constant Quality:  Bilious material Able to tolerate:  Liquids Progression:  Worsening Chronicity:  Recurrent Recent urination:  Normal Relieved by:  Nothing Worsened by:  Nothing Ineffective treatments:  None tried Associated symptoms: no abdominal pain, no cough, no diarrhea and no headaches        Home Medications Prior to Admission medications   Medication Sig Start Date End Date Taking? Authorizing Provider  baclofen (LIORESAL) 20 MG tablet Take 10-20 mg by mouth in the morning, at noon, and at bedtime. 20mg  morning and evening and 10mg  at noon 08/28/21  Yes [provider]  cephALEXin (KEFLEX) 500 MG capsule Take 1 capsule (500 mg total) by mouth 4 (four) times daily. 04/29/22  Yes Raul Del, Conner M, PA-C  cholecalciferol (VITAMIN D3) 25 MCG (1000 UNIT) tablet Take 1,000 Units by mouth daily.   Yes [provider]  Cyanocobalamin (VITAMIN B-12 PO) Take 1 tablet by mouth daily.   Yes [provider]  hydrochlorothiazide (MICROZIDE) 12.5 MG capsule Take 12.5 mg by mouth daily. 08/28/21  Yes [provider]  lisinopril (PRINIVIL,ZESTRIL) 10 MG tablet Take 10 mg by mouth daily.   Yes [provider]  oxyCODONE (ROXICODONE) 15 MG immediate release tablet Take 15 mg by mouth every 6 (six) hours. 08/04/21  Yes [provider]  pantoprazole (PROTONIX) 40 MG tablet Take 40 mg by mouth 2 (two) times daily. 12/31/19  Yes [provider]   pregabalin (LYRICA) 75 MG capsule Take 150 mg by mouth in the morning and at bedtime. 08/17/21  Yes [provider]  saccharomyces boulardii (FLORASTOR) 250 MG capsule Take 250 mg by mouth 2 (two) times daily.   Yes [provider]  silver sulfADIAZINE (SILVADENE) 1 % cream Apply to affected area daily as directed 05/03/22 05/03/23 Yes Johnson, Clanford L, MD  metoCLOPramide (REGLAN) 5 MG tablet Take 1 tablet (5 mg total) by mouth every 8 (eight) hours as needed for up to 3 days for refractory nausea / vomiting. 05/03/22 05/06/22  Murlean Iba, MD  ondansetron (ZOFRAN ODT) 4 MG disintegrating tablet 4mg  ODT q4 hours prn nausea/vomit 05/03/22   Johnson, Clanford L, MD      Allergies    Nsaids and Sulfa antibiotics    Review of Systems   Review of Systems  Constitutional:  Negative for appetite change and fatigue.  HENT:  Negative for congestion, ear discharge and sinus pressure.   Eyes:  Negative for discharge.  Respiratory:  Negative for cough.   Cardiovascular:  Negative for chest pain.  Gastrointestinal:  Positive for vomiting. Negative for abdominal pain and diarrhea.  Genitourinary:  Negative for frequency and hematuria.  Musculoskeletal:  Negative for back pain.  Skin:  Negative for rash.  Neurological:  Negative for seizures and headaches.  Psychiatric/Behavioral:  Negative for hallucinations.     Physical Exam Updated Vital Signs BP (!) 140/88 (BP Location: Left Arm)   Pulse 76  Temp 98.4 F (36.9 C)   Resp 14   Ht 5\' 11"  (1.803 m)   Wt 90.9 kg   SpO2 98%   BMI 27.95 kg/m  Physical Exam Vitals and nursing note reviewed.  Constitutional:      Appearance: He is well-developed.  HENT:     Head: Normocephalic.     Nose: Nose normal.  Eyes:     General: No scleral icterus.    Conjunctiva/sclera: Conjunctivae normal.  Neck:     Thyroid: No thyromegaly.  Cardiovascular:     Rate and Rhythm: Normal rate and regular rhythm.     Heart sounds:  No murmur heard.    No friction rub. No gallop.  Pulmonary:     Breath sounds: No stridor. No wheezing or rales.  Chest:     Chest wall: No tenderness.  Abdominal:     General: There is no distension.     Tenderness: There is no abdominal tenderness. There is no rebound.  Musculoskeletal:        General: Normal range of motion.     Cervical back: Neck supple.     Comments: Small cellulitis of left arm  Lymphadenopathy:     Cervical: No cervical adenopathy.  Skin:    Findings: No erythema or rash.  Neurological:     Mental Status: He is alert and oriented to person, place, and time.     Motor: No abnormal muscle tone.     Coordination: Coordination normal.  Psychiatric:        Behavior: Behavior normal.     ED Results / Procedures / Treatments   Labs (all labs ordered are listed, but only abnormal results are displayed) Labs Reviewed  CBC WITH DIFFERENTIAL/PLATELET - Abnormal; Notable for the following components:      Result Value   WBC 15.1 (*)    RBC 3.75 (*)    Hemoglobin 11.9 (*)    HCT 33.6 (*)    Neutro Abs 13.2 (*)    All other components within normal limits  COMPREHENSIVE METABOLIC PANEL - Abnormal; Notable for the following components:   Glucose, Bld 129 (*)    BUN 27 (*)    Creatinine, Ser 1.33 (*)    All other components within normal limits  URINALYSIS, ROUTINE W REFLEX MICROSCOPIC - Abnormal; Notable for the following components:   APPearance HAZY (*)    Protein, ur 30 (*)    All other components within normal limits  BASIC METABOLIC PANEL - Abnormal; Notable for the following components:   Potassium 3.2 (*)    CO2 20 (*)    Glucose, Bld 109 (*)    BUN 28 (*)    Creatinine, Ser 1.27 (*)    Calcium 8.8 (*)    All other components within normal limits  CBC - Abnormal; Notable for the following components:   WBC 12.7 (*)    RBC 3.29 (*)    Hemoglobin 10.2 (*)    HCT 29.8 (*)    All other components within normal limits  BASIC METABOLIC PANEL -  Abnormal; Notable for the following components:   CO2 20 (*)    Glucose, Bld 100 (*)    All other components within normal limits  CBC - Abnormal; Notable for the following components:   WBC 13.5 (*)    RBC 3.53 (*)    Hemoglobin 11.1 (*)    HCT 32.1 (*)    All other components within normal limits  BASIC METABOLIC PANEL -  Abnormal; Notable for the following components:   CO2 21 (*)    Glucose, Bld 105 (*)    Calcium 8.7 (*)    All other components within normal limits  CBC WITH DIFFERENTIAL/PLATELET - Abnormal; Notable for the following components:   RBC 3.38 (*)    Hemoglobin 10.4 (*)    HCT 30.2 (*)    All other components within normal limits  RESP PANEL BY RT-PCR (FLU A&B, COVID) ARPGX2  CULTURE, BLOOD (ROUTINE X 2)  CULTURE, BLOOD (ROUTINE X 2)  HIV ANTIBODY (ROUTINE TESTING W REFLEX)  MAGNESIUM    EKG None  Radiology MR ELBOW LEFT WO CONTRAST  Result Date: 05/03/2022 CLINICAL DATA:  Superficial left elbow soft tissue mass. Open wound of posterior elbow. EXAM: MRI OF THE LEFT ELBOW WITHOUT CONTRAST TECHNIQUE: Multiplanar, multisequence MR imaging of the elbow was performed. No intravenous contrast was administered. The technologist reports this was a very limited exam due to patient condition and ability to cooperate. Only axial and coronal T1-weighted images were able to be performed. COMPARISON:  Left elbow radiographs 04/29/2022 FINDINGS: Very limited study with only two sequences performed. Additionally, there is mild-to-moderate motion artifact that further limits evaluation. TENDONS Very limited evaluation of the common flexor and extensor tendons, biceps tendon insertion, brachialis tendon insertion, and triceps tendon insertion without a fluid sensitive sequence performed. No full-thickness tendon tear is seen. LIGAMENTS Medial stabilizers: Very limited evaluation of the ulnar collateral ligament, radial collateral ligament, and radioulnar collateral ligament in the  absence of a fluid sensitive sequence. No gross full-thickness tear is seen. Cartilage: Limited evaluation on only T1 weighted images. Joint: Moderate medial and lateral elbow osteoarthritis, including joint space narrowing at the medial aspect of the trochlea-coronoid process at the medial elbow and joint space narrowing at the radiocapitellar joint. Mild-to-moderate radial head-neck junction peripheral degenerative osteophytes. No definite joint effusion. Cubital tunnel: No gross abnormality is seen within the ulnar nerve on the provided T1 weighted images. No gross abnormality is seen within the median or radial nerves on limited T1 weighted images. Bones: On recent radiographs there is soft tissue swelling of the posterior elbow with the reported wound. There appears to be skin thinning of the posterior elbow at the level of the olecranon (axial series 12, image 18). On limited T1 weighted images, no definite soft tissue ulceration is seen contacting bone, however the decreased T1 signal within the posterior elbow subcutaneous fat is nonspecific and may represent subcutaneous fat edema or fluid that could contact bone. The lack of T2 weighted sequences is markedly limiting. No definitive cortical erosion to indicate osteomyelitis. IMPRESSION: 1. Very limited study with only two T1-weighted sequences performed due to patient condition. No fluid-sensitive sequences were able to be performed. 2. There is skin thinning of the posterior elbow at the level of the olecranon. On limited T1 weighted images, no definite soft tissue ulceration is seen contacting bone, however in the absence of T2 weighted imaging, the decreased T1 signal within the posterior elbow subcutaneous fat is nonspecific and may represent subcutaneous fat edema or fluid that could contact bone. No definitive cortical erosion to indicate osteomyelitis. 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 Electronically Signed   By: Neita Garnet  M.D.   On: 05/03/2022 11:54    Procedures Procedures    Medications Ordered in ED Medications  lactated ringers infusion ( Intravenous Rate/Dose Change 05/03/22 0906)  ondansetron (ZOFRAN) injection 4 mg (4 mg Intravenous Given 04/30/22 1259)  sodium chloride 0.9 %  bolus 1,000 mL (0 mLs Intravenous Stopped 04/30/22 1438)  LORazepam (ATIVAN) injection 0.5 mg (0.5 mg Intravenous Given 04/30/22 1322)  iohexol (OMNIPAQUE) 300 MG/ML solution 100 mL (100 mLs Intravenous Contrast Given 04/30/22 1329)  cefTRIAXone (ROCEPHIN) 2 g in sodium chloride 0.9 % 100 mL IVPB (0 g Intravenous Stopped 04/30/22 1535)  potassium chloride 10 mEq in 100 mL IVPB (0 mEq Intravenous Stopped 05/01/22 1513)  LORazepam (ATIVAN) injection 1 mg (1 mg Intravenous Given 05/03/22 0908)    ED Course/ Medical Decision Making/ A&P                           Medical Decision Making Amount and/or Complexity of Data Reviewed Labs: ordered. Radiology: ordered.  Risk Prescription drug management. Decision regarding hospitalization.  This patient presents to the ED for concern of vomiting, this involves an extensive number of treatment options, and is a complaint that carries with it a high risk of complications and morbidity.  The differential diagnosis includes gastroenteritis, sepsis   Co morbidities that complicate the patient evaluation  Hypertension and cellulitis to elbow   Additional history obtained:  Additional history obtained from patient External records from outside source obtained and reviewed including hospital records   Lab Tests:  I Ordered, and personally interpreted labs.  The pertinent results include: WBC 15   Imaging Studies ordered:  I ordered imaging studies including abdomen I independently visualized and interpreted imaging which showed negative I agree with the radiologist interpretation   Cardiac Monitoring: / EKG:  The patient was maintained on a cardiac monitor.  I  personally viewed and interpreted the cardiac monitored which showed an underlying rhythm of: Normal sinus rhythm   Consultations Obtained:  I requested consultation with the hospitalist,  and discussed lab and imaging findings as well as pertinent plan - they recommend: Admit   Problem List / ED Course / Critical interventions / Medication management  Hypertension and cellulitis I ordered medication including Zofran and normal saline Reevaluation of the patient after these medicines showed that the patient stayed the same I have reviewed the patients home medicines and have made adjustments as needed   Social Determinants of Health:  None   Test / Admission - Considered:  None  Patient with persistent vomiting of unknown cause.  He will be admitted to medicine        Final Clinical Impression(s) / ED Diagnoses Final diagnoses:  Cellulitis of left elbow    Rx / DC Orders ED Discharge Orders          Ordered    Ambulatory referral to Orthopedic Surgery       Comments: Hospital Follow up for left elbow   05/03/22 1210    ondansetron (ZOFRAN ODT) 4 MG disintegrating tablet        05/03/22 1229    metoCLOPramide (REGLAN) 5 MG tablet  Every 8 hours PRN        05/03/22 1229    silver sulfADIAZINE (SILVADENE) 1 % cream        05/03/22 1403              Bethann Berkshire, MD 05/04/22 1103

## 2022-05-05 LAB — CULTURE, BLOOD (ROUTINE X 2)
Culture: NO GROWTH
Culture: NO GROWTH

## 2022-05-06 LAB — AEROBIC CULTURE W GRAM STAIN (SUPERFICIAL SPECIMEN)

## 2022-05-07 DIAGNOSIS — G8929 Other chronic pain: Secondary | ICD-10-CM | POA: Insufficient documentation

## 2022-05-10 ENCOUNTER — Ambulatory Visit: Payer: No Typology Code available for payment source | Admitting: Orthopedic Surgery

## 2022-08-12 ENCOUNTER — Other Ambulatory Visit (HOSPITAL_COMMUNITY): Payer: Self-pay | Admitting: Family Medicine

## 2022-08-12 DIAGNOSIS — M4326 Fusion of spine, lumbar region: Secondary | ICD-10-CM

## 2022-08-12 DIAGNOSIS — M5416 Radiculopathy, lumbar region: Secondary | ICD-10-CM

## 2022-09-07 ENCOUNTER — Ambulatory Visit (HOSPITAL_COMMUNITY): Payer: No Typology Code available for payment source

## 2022-09-30 ENCOUNTER — Encounter (HOSPITAL_COMMUNITY): Payer: Self-pay

## 2022-09-30 ENCOUNTER — Ambulatory Visit (HOSPITAL_COMMUNITY): Admission: RE | Admit: 2022-09-30 | Payer: No Typology Code available for payment source | Source: Ambulatory Visit

## 2022-11-05 IMAGING — DX DG CHEST 1V PORT
1 series · 2 of 2 positions shown · non-contrast
Comparison: None.

CLINICAL DATA: Fever.

EXAM:
PORTABLE CHEST 1 VIEW

[Series 1: chest ap · 0.14mm/px · 2 of 2 slices shown]
[im 1/2]
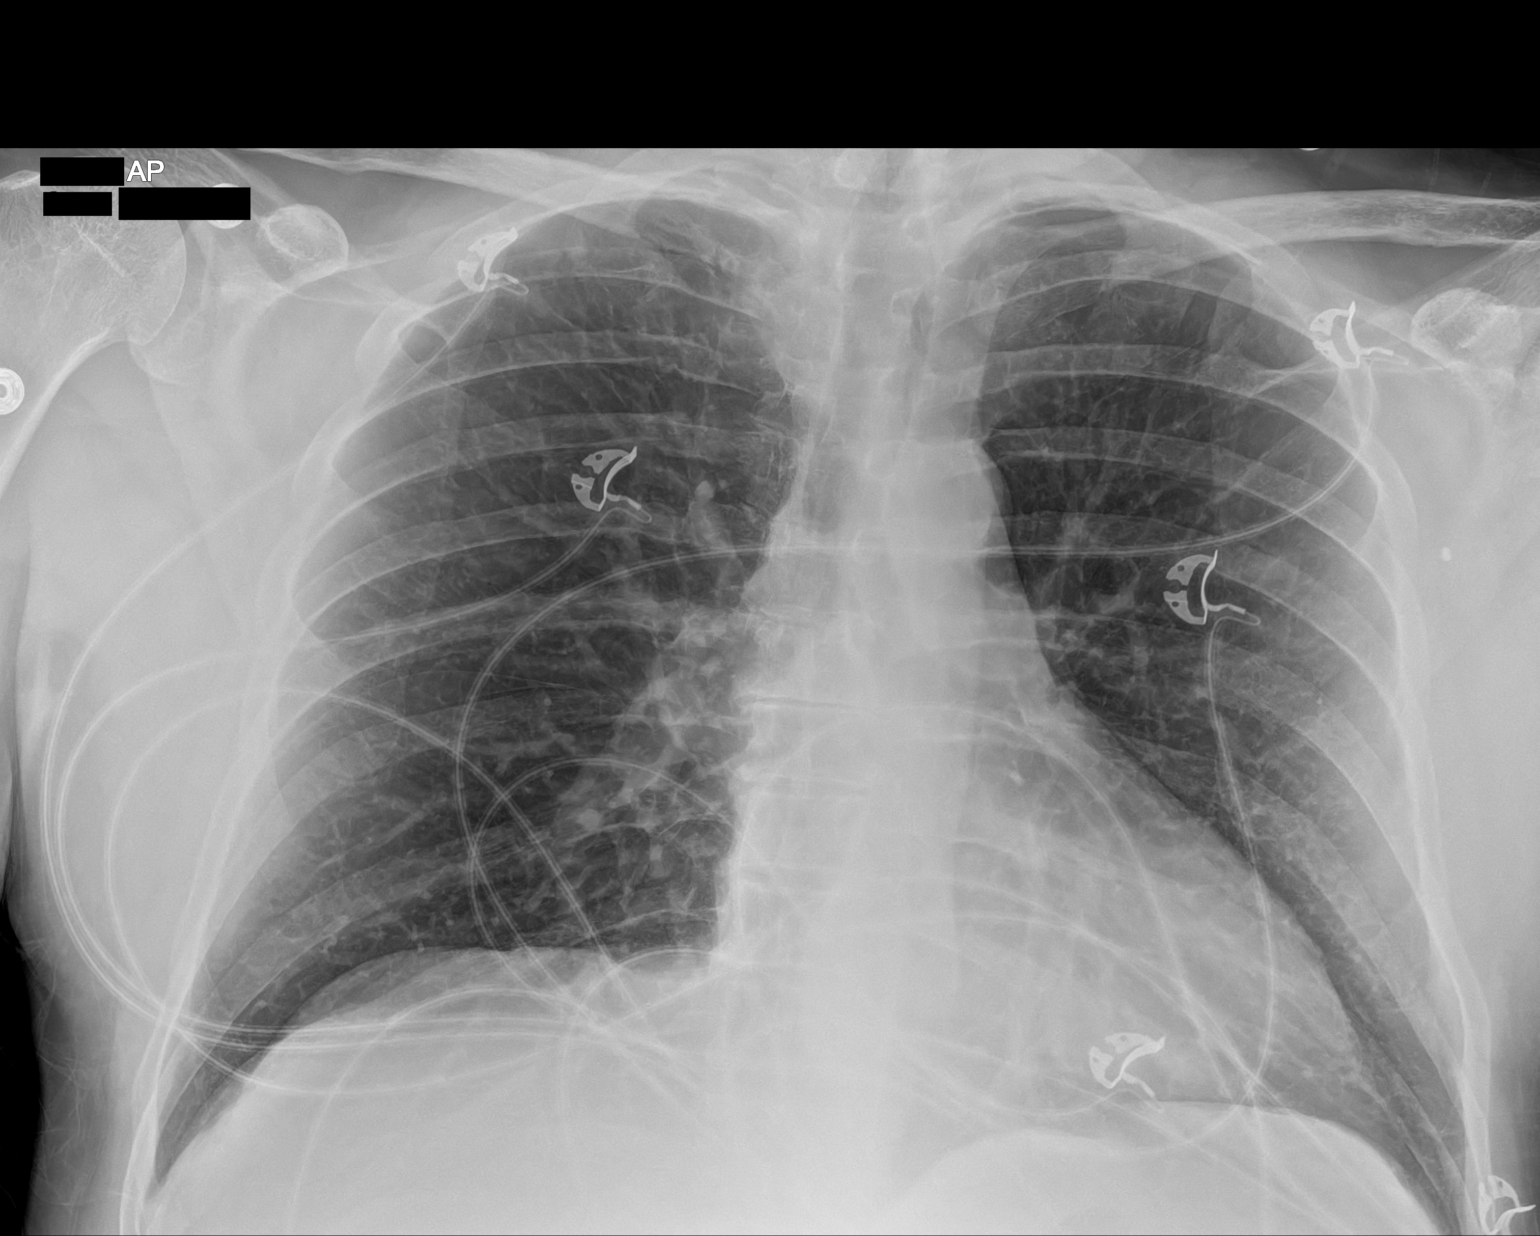
[im 2/2]
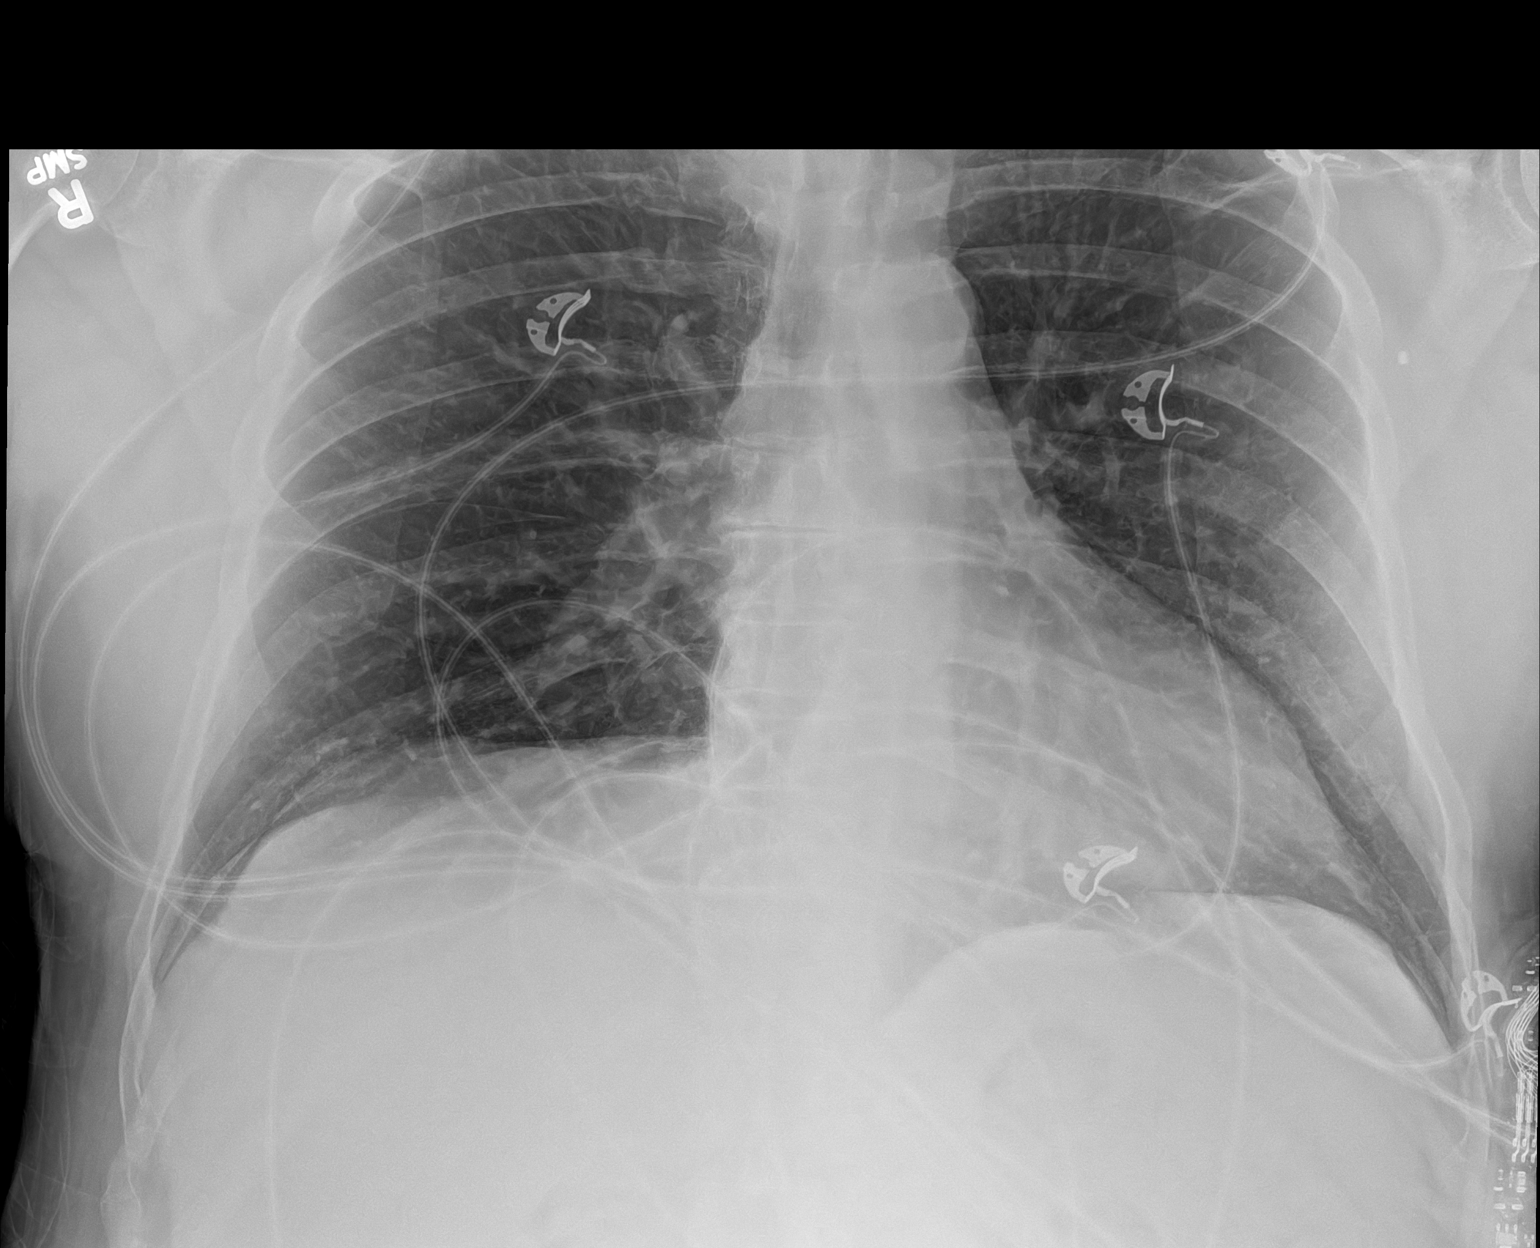

[2 of 2 positions shown; findings below may reference images not displayed]

FINDINGS: Heart size is normal. Atherosclerotic changes are noted at the
aortic arch. No edema or effusion is present. No focal airspace
disease present. Lung volumes are somewhat low. Soft tissues and
bony thorax are unremarkable.
IMPRESSION: 1. Low lung volumes.
2. No acute cardiopulmonary disease.
3. Aortic atherosclerosis.

## 2023-08-17 ENCOUNTER — Other Ambulatory Visit (HOSPITAL_COMMUNITY): Payer: Self-pay | Admitting: Internal Medicine

## 2023-08-17 DIAGNOSIS — E785 Hyperlipidemia, unspecified: Secondary | ICD-10-CM

## 2023-08-29 ENCOUNTER — Ambulatory Visit (HOSPITAL_COMMUNITY)
Admission: RE | Admit: 2023-08-29 | Discharge: 2023-08-29 | Disposition: A | Discharge status: Home / Self Care | Payer: No Typology Code available for payment source | Source: Ambulatory Visit | Attending: Internal Medicine | Admitting: Internal Medicine

## 2023-08-29 DIAGNOSIS — E785 Hyperlipidemia, unspecified: Secondary | ICD-10-CM | POA: Insufficient documentation

## 2023-12-24 ENCOUNTER — Encounter (HOSPITAL_COMMUNITY): Payer: Self-pay

## 2023-12-24 ENCOUNTER — Emergency Department (HOSPITAL_COMMUNITY)
Admission: EM | Admit: 2023-12-24 | Discharge: 2023-12-24 | Disposition: A | Discharge status: Home / Self Care | Attending: Emergency Medicine | Admitting: Emergency Medicine

## 2023-12-24 ENCOUNTER — Other Ambulatory Visit: Payer: Self-pay

## 2023-12-24 ENCOUNTER — Emergency Department (HOSPITAL_COMMUNITY)

## 2023-12-24 DIAGNOSIS — I10 Essential (primary) hypertension: Secondary | ICD-10-CM | POA: Diagnosis not present

## 2023-12-24 DIAGNOSIS — K5732 Diverticulitis of large intestine without perforation or abscess without bleeding: Secondary | ICD-10-CM | POA: Diagnosis not present

## 2023-12-24 DIAGNOSIS — K5792 Diverticulitis of intestine, part unspecified, without perforation or abscess without bleeding: Secondary | ICD-10-CM

## 2023-12-24 DIAGNOSIS — Z79899 Other long term (current) drug therapy: Secondary | ICD-10-CM | POA: Diagnosis not present

## 2023-12-24 DIAGNOSIS — R1032 Left lower quadrant pain: Secondary | ICD-10-CM | POA: Diagnosis present

## 2023-12-24 LAB — URINALYSIS, ROUTINE W REFLEX MICROSCOPIC
Bilirubin Urine: NEGATIVE
Glucose, UA: NEGATIVE mg/dL
Hgb urine dipstick: NEGATIVE
Ketones, ur: NEGATIVE mg/dL
Leukocytes,Ua: NEGATIVE
Nitrite: NEGATIVE
Protein, ur: NEGATIVE mg/dL
Specific Gravity, Urine: 1.004 — ABNORMAL LOW (ref 1.005–1.030)
pH: 7 (ref 5.0–8.0)

## 2023-12-24 LAB — COMPREHENSIVE METABOLIC PANEL WITH GFR
ALT: 23 U/L (ref 0–44)
AST: 26 U/L (ref 15–41)
Albumin: 3.8 g/dL (ref 3.5–5.0)
Alkaline Phosphatase: 43 U/L (ref 38–126)
Anion gap: 11 (ref 5–15)
BUN: 25 mg/dL — ABNORMAL HIGH (ref 8–23)
CO2: 23 mmol/L (ref 22–32)
Calcium: 9.4 mg/dL (ref 8.9–10.3)
Chloride: 104 mmol/L (ref 98–111)
Creatinine, Ser: 1.39 mg/dL — ABNORMAL HIGH (ref 0.61–1.24)
GFR, Estimated: 57 mL/min — ABNORMAL LOW (ref >60)
Glucose, Bld: 139 mg/dL — ABNORMAL HIGH (ref 70–99)
Potassium: 3.9 mmol/L (ref 3.5–5.1)
Sodium: 138 mmol/L (ref 135–145)
Total Bilirubin: 0.5 mg/dL (ref 0.0–1.2)
Total Protein: 6.8 g/dL (ref 6.5–8.1)

## 2023-12-24 LAB — CBC WITH DIFFERENTIAL/PLATELET
Abs Immature Granulocytes: 0.01 10*3/uL (ref 0.00–0.07)
Basophils Absolute: 0.1 10*3/uL (ref 0.0–0.1)
Basophils Relative: 1 %
Eosinophils Absolute: 0.3 10*3/uL (ref 0.0–0.5)
Eosinophils Relative: 5 %
HCT: 34.4 % — ABNORMAL LOW (ref 39.0–52.0)
Hemoglobin: 12.1 g/dL — ABNORMAL LOW (ref 13.0–17.0)
Immature Granulocytes: 0 %
Lymphocytes Relative: 27 %
Lymphs Abs: 1.6 10*3/uL (ref 0.7–4.0)
MCH: 31.8 pg (ref 26.0–34.0)
MCHC: 35.2 g/dL (ref 30.0–36.0)
MCV: 90.3 fL (ref 80.0–100.0)
Monocytes Absolute: 0.7 10*3/uL (ref 0.1–1.0)
Monocytes Relative: 12 %
Neutro Abs: 3.3 10*3/uL (ref 1.7–7.7)
Neutrophils Relative %: 55 %
Platelets: 284 10*3/uL (ref 150–400)
RBC: 3.81 MIL/uL — ABNORMAL LOW (ref 4.22–5.81)
RDW: 13.5 % (ref 11.5–15.5)
WBC: 5.9 10*3/uL (ref 4.0–10.5)
nRBC: 0 % (ref 0.0–0.2)

## 2023-12-24 LAB — LIPASE, BLOOD: Lipase: 28 U/L (ref 11–51)

## 2023-12-24 MED ORDER — AMOXICILLIN-POT CLAVULANATE 875-125 MG PO TABS: 1.0000 | ORAL_TABLET | Freq: Two times a day (BID) | ORAL | 0 refills | Status: DC

## 2023-12-24 MED ORDER — IOHEXOL 300 MG/ML  SOLN
100.0000 mL | Freq: Once | INTRAMUSCULAR | Status: AC | PRN
  Administered 2023-12-24: 100 mL via INTRAVENOUS

## 2023-12-24 MED ORDER — LORAZEPAM 2 MG/ML IJ SOLN
0.5000 mg | Freq: Once | INTRAMUSCULAR | Status: AC
  Administered 2023-12-24: 0.5 mg via INTRAVENOUS
  Filled 2023-12-24: qty 1

## 2023-12-24 MED ORDER — SODIUM CHLORIDE 0.9 % IV BOLUS
500.0000 mL | Freq: Once | INTRAVENOUS | Status: AC
  Administered 2023-12-24: 500 mL via INTRAVENOUS

## 2023-12-24 NOTE — ED Triage Notes (Signed)
 Pt complaining of:  Lower right groin pain that is possibly associated with constipation  Burning in rectum and having a hard time going urinating (pt has to cath himself)

## 2023-12-24 NOTE — ED Notes (Signed)
 Advised pt of plan for CT and he became anxious and tearful d/t claustrophobia. Educated on the test and that his head will not be in the machine. Stated that he would not be able to tolerate it. MD notified.

## 2023-12-24 NOTE — ED Provider Notes (Signed)
 [place] EMERGENCY DEPARTMENT AT [facility] Provider Note   CSN: [id number] Arrival date & time: 12/24/23  1505     Patient presents with: Multiple Complaints   John Jordan is a 63 y.o. male.   Pt is a 63 yo male with pmhx significant for diverticulitis, GERD, HTN, depression, arthritis, and chronic back pain.  Pt presents to the ED today with left groin pain and rectal pain.  He does have to self cath due to neurogenic bladder. No fevers.       Prior to Admission medications   Medication Sig Start Date End Date Taking? Authorizing Provider  amoxicillin-clavulanate (AUGMENTIN) 875-125 MG tablet Take 1 tablet by mouth every 12 (twelve) hours. 12/24/23  Yes Dean Clarity, MD  baclofen  (LIORESAL ) 20 MG tablet Take 10-20 mg by mouth in the morning, at noon, and at bedtime. 20mg  morning and evening and 10mg  at noon 08/28/21   [provider]  cephALEXin  (KEFLEX ) 500 MG capsule Take 1 capsule (500 mg total) by mouth 4 (four) times daily. 04/29/22   Theotis Cameron HERO, PA-C  cholecalciferol (VITAMIN D3) 25 MCG (1000 UNIT) tablet Take 1,000 Units by mouth daily.    [provider]  Cyanocobalamin (VITAMIN B-12 PO) Take 1 tablet by mouth daily.    [provider]  hydrochlorothiazide (MICROZIDE) 12.5 MG capsule Take 12.5 mg by mouth daily. 08/28/21   [provider]  lisinopril  (PRINIVIL ,ZESTRIL ) 10 MG tablet Take 10 mg by mouth daily.    [provider]  metoCLOPramide  (REGLAN ) 5 MG tablet Take 1 tablet (5 mg total) by mouth every 8 (eight) hours as needed for up to 3 days for refractory nausea / vomiting. 05/03/22 05/06/22  Vicci Afton CROME, MD  ondansetron  (ZOFRAN  ODT) 4 MG disintegrating tablet 4mg  ODT q4 hours prn nausea/vomit 05/03/22   Johnson, Clanford L, MD  oxyCODONE  (ROXICODONE ) 15 MG immediate release tablet Take 15 mg by mouth every 6 (six) hours. 08/04/21   [provider]  pantoprazole  (PROTONIX ) 40 MG tablet  Take 40 mg by mouth 2 (two) times daily. 12/31/19   [provider]  pregabalin (LYRICA) 75 MG capsule Take 150 mg by mouth in the morning and at bedtime. 08/17/21   [provider]  saccharomyces boulardii (FLORASTOR) 250 MG capsule Take 250 mg by mouth 2 (two) times daily.    [provider]    Allergies: Nsaids and Sulfa antibiotics    Review of Systems  Gastrointestinal:  Positive for abdominal pain and constipation.  All other systems reviewed and are negative.   Updated Vital Signs BP 115/78   Pulse 89   Temp 98.6 F (37 C) (Temporal)   Resp 18   Ht 6' 3 (1.905 m)   Wt 97.5 kg   SpO2 95%   BMI 26.87 kg/m   Physical Exam Vitals and nursing note reviewed. Exam conducted with a chaperone present.  Constitutional:      Appearance: Normal appearance.  HENT:     Head: Normocephalic and atraumatic.     Right Ear: External ear normal.     Left Ear: External ear normal.     Nose: Nose normal.     Mouth/Throat:     Mouth: Mucous membranes are moist.     Pharynx: Oropharynx is clear.   Eyes:     Extraocular Movements: Extraocular movements intact.     Conjunctiva/sclera: Conjunctivae normal.     Pupils: Pupils are equal, round, and reactive to light.  Cardiovascular:     Rate and Rhythm: Normal rate and regular rhythm.     Pulses: Normal pulses.     Heart sounds: Normal heart sounds.  Pulmonary:     Effort: Pulmonary effort is normal.     Breath sounds: Normal breath sounds.  Abdominal:     General: Abdomen is flat.     Palpations: Abdomen is soft.     Tenderness: There is abdominal tenderness in the left lower quadrant.  Genitourinary:    Comments: No fecal impaction  Musculoskeletal:        General: Normal range of motion.     Cervical back: Normal range of motion and neck supple.   Skin:    General: Skin is warm.     Capillary Refill: Capillary refill takes less than 2 seconds.   Neurological:     General: No focal deficit  present.     Mental Status: He is alert and oriented to person, place, and time.   Psychiatric:        Mood and Affect: Mood normal.        Behavior: Behavior normal.     (all labs ordered are listed, but only abnormal results are displayed) Labs Reviewed  CBC WITH DIFFERENTIAL/PLATELET - Abnormal; Notable for the following components:      Result Value   RBC 3.81 (*)    Hemoglobin 12.1 (*)    HCT 34.4 (*)    All other components within normal limits  COMPREHENSIVE METABOLIC PANEL WITH GFR - Abnormal; Notable for the following components:   Glucose, Bld 139 (*)    BUN 25 (*)    Creatinine, Ser 1.39 (*)    GFR, Estimated 57 (*)    All other components within normal limits  URINALYSIS, ROUTINE W REFLEX MICROSCOPIC - Abnormal; Notable for the following components:   Color, Urine STRAW (*)    Specific Gravity, Urine 1.004 (*)    All other components within normal limits  LIPASE, BLOOD    EKG: None  Radiology: CT ABDOMEN PELVIS W CONTRAST Result Date: 12/24/2023 CLINICAL DATA:  Left lower quadrant abdominal pain. Lower right groin pain with associated constipation. Burning and rectum. Difficulty urinating. EXAM: CT ABDOMEN AND PELVIS WITH CONTRAST TECHNIQUE: Multidetector CT imaging of the abdomen and pelvis was performed using the standard protocol following bolus administration of intravenous contrast. RADIATION DOSE REDUCTION: This exam was performed according to the departmental dose-optimization program which includes automated exposure control, adjustment of the mA and/or kV according to patient size and/or use of iterative reconstruction technique. CONTRAST:  OMNIPAQUE  IOHEXOL  300 MG/ML  SOLN COMPARISON:  04/30/2022. FINDINGS: Lower chest: Atelectasis is present at the lung bases. Hepatobiliary: No focal abnormality in the liver. The gallbladder is decompressed. No biliary ductal dilatation. Pancreas: Unremarkable. No pancreatic ductal dilatation or surrounding inflammatory  changes. Spleen: Normal in size without focal abnormality. Adrenals/Urinary Tract: There is a right adrenal nodule measuring 1.7 cm, previously characterized as adenoma. The left adrenal gland is within normal limits. The kidneys enhance symmetrically. Multiple cysts are present in the left kidney. No renal calculus or obstructive uropathy bilaterally. The bladder is within normal limits. Stomach/Bowel: The stomach is within normal limits. No bowel obstruction, free air, or pneumatosis is seen. Scattered diverticula are present along the colon. There is pericolonic fat stranding and involving the sigmoid colon, axial image 62. No abscess or free air seen. The proximal appendix is normal in caliber and contains air. The distal appendix is distended at  9 mm. Vascular/Lymphatic: Aortic atherosclerosis. No enlarged abdominal or pelvic lymph nodes. Reproductive: The prostate gland is normal in size. Other: No abdominopelvic ascites. A fat containing inguinal hernias present on the left. A small fat containing periumbilical hernia is noted. Musculoskeletal: Degenerative changes are present in the thoracolumbar spine. No acute osseous abnormality. Spinal fusion hardware is present from L5-S1. No acute osseous abnormality. IMPRESSION: 1. Mild diverticulitis involving the sigmoid colon. No free air or abscess is seen. 2. Prominence of the distal appendix measuring 9 mm with no surrounding inflammatory changes. Correlate clinically to exclude early appendicitis. 3. Right adrenal adenoma. Electronically Signed   By: Leita Birmingham M.D.   On: 12/24/2023 17:59     Procedures   Medications Ordered in the ED  sodium chloride  0.9 % bolus 500 mL (0 mLs Intravenous Stopped 12/24/23 1635)  LORazepam  (ATIVAN ) injection 0.5 mg (0.5 mg Intravenous Given 12/24/23 1656)  iohexol  (OMNIPAQUE ) 300 MG/ML solution 100 mL (100 mLs Intravenous Contrast Given 12/24/23 1658)                                    Medical Decision  Making Amount and/or Complexity of Data Reviewed Labs: ordered. Radiology: ordered.  Risk Prescription drug management.   This patient presents to the ED for concern of abd pain, this involves an extensive number of treatment options, and is a complaint that carries with it a high risk of complications and morbidity.  The differential diagnosis includes constipation, hernia, diverticulitis, colitis   Co morbidities that complicate the patient evaluation   diverticulitis, GERD, HTN, depression, arthritis, and chronic back pain   Additional history obtained:  Additional history obtained from epic chart review External records from outside source obtained and reviewed including family   Lab Tests:  I Ordered, and personally interpreted labs.  The pertinent results include:  cbc with hgb sl low at 12.1 (hgb 10.4 in 2023); cmp with bun 25 and cr 1.39 (bun 14 and cr 1.16 in 2023, but cr has been up as high as 1.63; ua neg   Imaging Studies ordered:  I ordered imaging studies including ct abd/pelvis  I independently visualized and interpreted imaging which showed  1. Mild diverticulitis involving the sigmoid colon. No free air or  abscess is seen.  2. Prominence of the distal appendix measuring 9 mm with no  surrounding inflammatory changes. Correlate clinically to exclude  early appendicitis.  3. Right adrenal adenoma.   I agree with the radiologist interpretation   Cardiac Monitoring:  The patient was maintained on a cardiac monitor.  I personally viewed and interpreted the cardiac monitored which showed an underlying rhythm of: nsr   Medicines ordered and prescription drug management:  I ordered medication including ivfs  for sx  Reevaluation of the patient after these medicines showed that the patient improved I have reviewed the patients home medicines and have made adjustments as needed   Test Considered:  ct   Critical Interventions:  abx   Problem List  / ED Course:  Acute diverticulitis:  pt does have tenderness in the LLQ.  Appendix on CT is slightly enlarged, but no surrounding inflammatory changes.  No pain to RLQ, so doubt appy.  Pt told of this finding and to return if he develops RLQ pain.  Pt d/c with augmentin.  He is stable for d/c.  Return if worse.     Reevaluation:  After the  interventions noted above, I reevaluated the patient and found that they have :improved   Social Determinants of Health:  Lives at home   Dispostion:  After consideration of the diagnostic results and the patients response to treatment, I feel that the patent would benefit from discharge with outpatient f/u.       Final diagnoses:  Diverticulitis    ED Discharge Orders          Ordered    amoxicillin-clavulanate (AUGMENTIN) 875-125 MG tablet  Every 12 hours        12/24/23 1823               Dean Clarity, MD 12/24/23 [phone]

## 2023-12-24 NOTE — ED Notes (Addendum)
 Pt ambulatory to bathroom with cane, steady gait, no complaints

## 2023-12-27 ENCOUNTER — Encounter (HOSPITAL_COMMUNITY)
Admission: RE | Admit: 2023-12-27 | Discharge: 2023-12-27 | Disposition: A | Discharge status: Home / Self Care | Source: Ambulatory Visit | Attending: Optometry | Admitting: Optometry

## 2023-12-27 ENCOUNTER — Encounter (HOSPITAL_COMMUNITY): Payer: Self-pay

## 2023-12-28 NOTE — H&P (Signed)
 Surgical History & Physical  Patient Name: John Jordan  DOB: 1961-05-01  Surgery: Cataract extraction with intraocular lens implant phacoemulsification; Right Eye Surgeon: Marsa Cleverly MD Surgery Date: 12/30/2023 Pre-Op Date: 12/20/2023  HPI: A 83 Yr. old male patient 1.  The patient is here today for Cataract Evaluation. The patient complains of difficulty when reading fine print, books, newspaper, instructions etc., which began many years ago. Both eyes are affected. The episode is constant. The patient describes foggy symptoms affecting their eyes/vision. This is negatively affecting the patient's quality of life and the patient is unable to function adequately in life with the current level of vision. HPI was performed by Marsa Cleverly .  Medical History: Cataracts  Arthritis High Blood Pressure  Review of Systems Cardiovascular High Blood Pressure Endocrine high cholesterol Musculoskeletal arthritis All recorded systems are negative except as noted above.  Social Never smoked   Medication Lisinopril , Rosuvastatin, Pantoprazole , Hydrochlorothiazide, Baclofen , Pregabalin, Hydroxyzine hcl, Oxycodone   Sx/Procedures None  Drug Allergies  Sulfa  History & Physical: Heent: cataracts NECK: supple without bruits LUNGS: lungs clear to auscultation CV: regular rate and rhythm Abdomen: soft and non-tender  Impression & Plan: Assessment: 1.  CATARACT NUCLEAR SCLEROSIS AGE RELATED; Both Eyes (H25.13) 2.  DERMATOCHALASIS, no surgery; Right Upper Lid, Left Upper Lid (H02.831, H02.834) 3.  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  Cataracts are visually significant and account for the patient's complaints. Discussed all risks, benefits, procedures and recovery, including infection, loss of vision and eye, need for glasses after surgery or additional procedures. Patient understands changing glasses will not improve vision. Patient indicated understanding of procedure. All questions  answered. Patient desires to have surgery, recommend phacoemulsification with intraocular lens. Patient to have preliminary testing necessary (Argos/IOL Master, Mac OCT, TOPO) Educational materials provided:Cataract.  Plan: - Proceed with cataract surgery OD, followed by OS - Plan for best distance target with DIB00 - No DM, no fuchs, no prior eye surgery - good dilation - Dextenza if available  2.  Not visually significant, monitor  3.  Gets glasses through the [place]

## 2023-12-30 ENCOUNTER — Ambulatory Visit (HOSPITAL_COMMUNITY): Payer: Self-pay | Admitting: Anesthesiology

## 2023-12-30 ENCOUNTER — Encounter (HOSPITAL_COMMUNITY)
Admission: RE | Disposition: A | Discharge status: Home / Self Care | Payer: Self-pay | Source: Home / Self Care | Attending: Optometry

## 2023-12-30 ENCOUNTER — Encounter (HOSPITAL_COMMUNITY): Payer: Self-pay | Admitting: Optometry

## 2023-12-30 ENCOUNTER — Ambulatory Visit (HOSPITAL_COMMUNITY)
Admission: RE | Admit: 2023-12-30 | Discharge: 2023-12-30 | Disposition: A | Discharge status: Home / Self Care | Attending: Optometry | Admitting: Optometry

## 2023-12-30 DIAGNOSIS — G709 Myoneural disorder, unspecified: Secondary | ICD-10-CM | POA: Insufficient documentation

## 2023-12-30 DIAGNOSIS — N289 Disorder of kidney and ureter, unspecified: Secondary | ICD-10-CM | POA: Insufficient documentation

## 2023-12-30 DIAGNOSIS — K219 Gastro-esophageal reflux disease without esophagitis: Secondary | ICD-10-CM | POA: Diagnosis not present

## 2023-12-30 DIAGNOSIS — I10 Essential (primary) hypertension: Secondary | ICD-10-CM | POA: Diagnosis not present

## 2023-12-30 DIAGNOSIS — H2511 Age-related nuclear cataract, right eye: Secondary | ICD-10-CM

## 2023-12-30 DIAGNOSIS — H02831 Dermatochalasis of right upper eyelid: Secondary | ICD-10-CM | POA: Insufficient documentation

## 2023-12-30 DIAGNOSIS — H02834 Dermatochalasis of left upper eyelid: Secondary | ICD-10-CM | POA: Diagnosis not present

## 2023-12-30 DIAGNOSIS — H524 Presbyopia: Secondary | ICD-10-CM | POA: Diagnosis not present

## 2023-12-30 HISTORY — PX: CATARACT EXTRACTION W/PHACO: SHX586

## 2023-12-30 SURGERY — PHACOEMULSIFICATION, CATARACT, WITH IOL INSERTION
Anesthesia: Monitor Anesthesia Care | Site: Eye | Laterality: Right

## 2023-12-30 MED ORDER — POVIDONE-IODINE 5 % OP SOLN
OPHTHALMIC | Status: DC | PRN
  Administered 2023-12-30: 1 via OPHTHALMIC

## 2023-12-30 MED ORDER — SIGHTPATH DOSE#1 NA HYALUR & NA CHOND-NA HYALUR IO KIT
PACK | INTRAOCULAR | Status: DC | PRN
  Administered 2023-12-30: 1 via OPHTHALMIC

## 2023-12-30 MED ORDER — SODIUM CHLORIDE 0.9% FLUSH
INTRAVENOUS | Status: DC | PRN
Start: 2023-12-30 — End: 2023-12-30
  Administered 2023-12-30 (×2): 5 mL via INTRAVENOUS

## 2023-12-30 MED ORDER — TETRACAINE 0.5 % OP SOLN OPTIME - NO CHARGE
OPHTHALMIC | Status: DC | PRN
  Administered 2023-12-30: 1 [drp] via OPHTHALMIC

## 2023-12-30 MED ORDER — LIDOCAINE HCL (PF) 1 % IJ SOLN
INTRAMUSCULAR | Status: DC | PRN
  Administered 2023-12-30: 1 mL

## 2023-12-30 MED ORDER — PHENYLEPHRINE HCL 2.5 % OP SOLN
1.0000 [drp] | OPHTHALMIC | Status: AC
  Administered 2023-12-30 (×3): 1 [drp] via OPHTHALMIC

## 2023-12-30 MED ORDER — LACTATED RINGERS IV SOLN: INTRAVENOUS | Status: DC

## 2023-12-30 MED ORDER — LIDOCAINE HCL 3.5 % OP GEL
1.0000 | Freq: Once | OPHTHALMIC | Status: AC
Start: 2023-12-30 — End: 2023-12-30
  Administered 2023-12-30: 1 via OPHTHALMIC

## 2023-12-30 MED ORDER — TROPICAMIDE 1 % OP SOLN
1.0000 [drp] | OPHTHALMIC | Status: AC
  Administered 2023-12-30 (×3): 1 [drp] via OPHTHALMIC

## 2023-12-30 MED ORDER — MIDAZOLAM HCL 2 MG/2ML IJ SOLN
INTRAMUSCULAR | Status: AC
Start: 2023-12-30 — End: 2023-12-30
  Filled 2023-12-30: qty 2

## 2023-12-30 MED ORDER — MIDAZOLAM HCL 5 MG/5ML IJ SOLN
INTRAMUSCULAR | Status: DC | PRN
Start: 2023-12-30 — End: 2023-12-30
  Administered 2023-12-30 (×2): 1 mg via INTRAVENOUS

## 2023-12-30 MED ORDER — MOXIFLOXACIN HCL 5 MG/ML IO SOLN
INTRAOCULAR | Status: DC | PRN
  Administered 2023-12-30: .2 mL via OPHTHALMIC

## 2023-12-30 MED ORDER — STERILE WATER FOR IRRIGATION IR SOLN
Status: DC | PRN
Start: 2023-12-30 — End: 2023-12-30
  Administered 2023-12-30: 250 mL

## 2023-12-30 MED ORDER — TETRACAINE HCL 0.5 % OP SOLN
1.0000 [drp] | OPHTHALMIC | Status: AC
  Administered 2023-12-30 (×3): 1 [drp] via OPHTHALMIC

## 2023-12-30 MED ORDER — BSS IO SOLN
INTRAOCULAR | Status: DC | PRN
  Administered 2023-12-30: 15 mL via INTRAOCULAR

## 2023-12-30 MED ORDER — PHENYLEPHRINE-KETOROLAC 1-0.3 % IO SOLN
INTRAOCULAR | Status: DC | PRN
  Administered 2023-12-30: 500 mL via OPHTHALMIC

## 2023-12-30 SURGICAL SUPPLY — 14 items
CATARACT SUITE SIGHTPATH (MISCELLANEOUS) ×1 IMPLANT
CLOTH BEACON ORANGE TIMEOUT ST (SAFETY) ×1 IMPLANT
DRSG TEGADERM 4X4.75 (GAUZE/BANDAGES/DRESSINGS) ×1 IMPLANT
EYE SHIELD UNIVERSAL CLEAR (GAUZE/BANDAGES/DRESSINGS) IMPLANT
FEE CATARACT SUITE SIGHTPATH (MISCELLANEOUS) ×1 IMPLANT
GLOVE BIOGEL PI IND STRL 7.0 (GLOVE) ×2 IMPLANT
LENS IOL TECNIS EYHANCE 20.5 (Intraocular Lens) IMPLANT
NDL HYPO 18GX1.5 BLUNT FILL (NEEDLE) ×1 IMPLANT
NEEDLE HYPO 18GX1.5 BLUNT FILL (NEEDLE) ×1 IMPLANT
PAD ARMBOARD POSITIONER FOAM (MISCELLANEOUS) ×1 IMPLANT
POSITIONER HEAD 8X9X4 ADT (SOFTGOODS) ×1 IMPLANT
SYR TB 1ML LL NO SAFETY (SYRINGE) ×1 IMPLANT
TAPE SURG TRANSPORE 1 IN (GAUZE/BANDAGES/DRESSINGS) IMPLANT
WATER STERILE IRR 250ML POUR (IV SOLUTION) ×1 IMPLANT

## 2023-12-30 NOTE — Discharge Instructions (Signed)
 Please discharge patient when stable, will follow up today with Dr. Ilsa Iha at the [facility] office immediately following discharge.  Leave shield in place until visit.  All paperwork with discharge instructions will be given at the office.  [facility] Address:  [address]  [place], [place] [address]  Dr. Chaya Jan Phone: [phone]

## 2023-12-30 NOTE — Transfer of Care (Signed)
 Immediate Anesthesia Transfer of Care Note  Patient: John Jordan  Procedure(s) Performed: PHACOEMULSIFICATION, CATARACT, WITH IOL INSERTION (Right: Eye)  Patient Location: Short Stay  Anesthesia Type:MAC  Level of Consciousness: awake, alert , oriented, and patient cooperative  Airway & Oxygen Therapy: Patient Spontanous Breathing  Post-op Assessment: Report given to RN and Post -op Vital signs reviewed and stable  Post vital signs: Reviewed and stable  Last Vitals:  Vitals Value Taken Time  BP 106/78 12/30/23 08:21  Temp    Pulse 70 12/30/23 08:21  Resp 12 12/30/23 08:21  SpO2 96% RA 12/30/23 08:21    Last Pain:  Vitals:   12/30/23 0726  PainSc: 5       Patients Stated Pain Goal: 4 (12/30/23 0726)  Complications: No notable events documented.

## 2023-12-30 NOTE — Interval H&P Note (Signed)
 History and Physical Interval Note:  12/30/2023 7:13 AM  The H and P was reviewed and updated. The patient was examined.  No changes were found after exam.  The surgical eye was marked.   Jenna Ardoin

## 2023-12-30 NOTE — Anesthesia Postprocedure Evaluation (Signed)
 Anesthesia Post Note  Patient: Javeon Macmurray  Procedure(s) Performed: PHACOEMULSIFICATION, CATARACT, WITH IOL INSERTION (Right: Eye)  Patient location during evaluation: Phase II Anesthesia Type: MAC Level of consciousness: awake Pain management: pain level controlled Vital Signs Assessment: post-procedure vital signs reviewed and stable Respiratory status: spontaneous breathing and respiratory function stable Cardiovascular status: blood pressure returned to baseline and stable Postop Assessment: no headache and no apparent nausea or vomiting Anesthetic complications: no Comments: Late entry   There were no known notable events for this encounter.   Last Vitals:  Vitals:   12/30/23 0728 12/30/23 0821  BP: 125/70 106/78  Pulse:  74  Resp: 14 12  Temp: 36.6 C 36.6 C  SpO2: 96% 98%    Last Pain:  Vitals:   12/30/23 0821  PainSc: 0-No pain                 Yvonna JINNY Bosworth

## 2023-12-30 NOTE — Op Note (Signed)
 Date of procedure: 12/30/23  Pre-operative diagnosis: Visually significant age-related nuclear cataract, Right Eye (H25.11)  Post-operative diagnosis: Visually significant age-related nuclear cataract, Right Eye H25.11  Procedure: Removal of cataract via phacoemulsification and insertion of intra-ocular lens J&J DIBOO +20.5D into the capsular bag of the Right Eye  Attending surgeon: Marsa Cleverly, MD  Anesthesia: MAC, Topical Akten  Complications: None  Estimated Blood Loss: <69mL (minimal)  Specimens: None  Implants:  Implant Name Type Inv. Item Serial No. Manufacturer Lot No. LRB No. Used Action  LENS IOL TECNIS EYHANCE 20.5 - [id number] Intraocular Lens LENS IOL TECNIS EYHANCE 20.5 [id number] SIGHTPATH  Right 1 Implanted    Indications:  Visually significant age-related cataract, Right Eye  Procedure:  The patient was seen and identified in the pre-operative area. The operative eye was identified and dilated.  The operative eye was marked.  Topical anesthesia was administered to the operative eye.     The patient was then to the operative suite and placed in the supine position.  A timeout was performed confirming the patient, procedure to be performed, and all other relevant information.   The patient's face was prepped and draped in the usual fashion for intra-ocular surgery.  A lid speculum was placed into the operative eye and the surgical microscope moved into place and focused.  A superotemporal paracentesis was created using a 20 gauge paracentesis blade.  BSS mixed with Omidria, followed by 1% lidocaine was injected into the anterior chamber.  Viscoelastic was injected into the anterior chamber.  A temporal clear-corneal main wound incision was created using a 2.39mm microkeratome.  A continuous curvilinear capsulorrhexis was initiated using an irrigating cystitome and completed using capsulorrhexis forceps.  Hydrodissection and hydrodeliniation were performed.  Viscoelastic  was injected into the anterior chamber.  A phacoemulsification handpiece and a chopper as a second instrument were used to remove the nucleus and epinucleus. The irrigation/aspiration handpiece was used to remove any remaining cortical material.   The capsular bag was reinflated with viscoelastic, checked, and found to be intact.  The intraocular lens was inserted into the capsular bag.  The irrigation/aspiration handpiece was used to remove any remaining viscoelastic.  The clear corneal wound and paracentesis wounds were then hydrated and checked with Weck-Cels to be watertight. Moxifloxacin was instilled into the anterior chamber.  The lid-speculum and drape were removed. The patient's face was cleaned with a wet and dry 4x4. A clear shield was taped over the eye. The patient was taken to the post-operative care unit in good condition, having tolerated the procedure well.  Post-Op Instructions: The patient will follow up at [facility] for a same day post-operative evaluation and will receive all other orders and instructions.

## 2023-12-30 NOTE — Anesthesia Preprocedure Evaluation (Signed)
 Anesthesia Evaluation  Patient identified by MRN, date of birth, ID band Patient awake    Reviewed: Allergy & Precautions, H&P , NPO status , Patient's Chart, lab work & pertinent test results, reviewed documented beta blocker date and time   Airway Mallampati: II  TM Distance: >3 FB Neck ROM: full    Dental no notable dental hx.    Pulmonary neg pulmonary ROS, Current Smoker   Pulmonary exam normal breath sounds clear to auscultation       Cardiovascular Exercise Tolerance: Good hypertension, negative cardio ROS  Rhythm:regular Rate:Normal     Neuro/Psych  PSYCHIATRIC DISORDERS Anxiety Depression     Neuromuscular disease negative neurological ROS  negative psych ROS   GI/Hepatic negative GI ROS, Neg liver ROS,GERD  ,,  Endo/Other  negative endocrine ROS    Renal/GU Renal diseasenegative Renal ROS  negative genitourinary   Musculoskeletal   Abdominal   Peds  Hematology negative hematology ROS (+) Blood dyscrasia, anemia   Anesthesia Other Findings   Reproductive/Obstetrics negative OB ROS                             Anesthesia Physical Anesthesia Plan  ASA: 2  Anesthesia Plan: MAC   Post-op Pain Management:    Induction:   PONV Risk Score and Plan:   Airway Management Planned:   Additional Equipment:   Intra-op Plan:   Post-operative Plan:   Informed Consent: I have reviewed the patients History and Physical, chart, labs and discussed the procedure including the risks, benefits and alternatives for the proposed anesthesia with the patient or authorized representative who has indicated his/her understanding and acceptance.     Dental Advisory Given  Plan Discussed with: CRNA  Anesthesia Plan Comments:        Anesthesia Quick Evaluation

## 2024-01-02 ENCOUNTER — Encounter (HOSPITAL_COMMUNITY): Payer: Self-pay | Admitting: Optometry

## 2024-01-09 ENCOUNTER — Encounter (HOSPITAL_COMMUNITY): Payer: Self-pay | Admitting: Optometry

## 2024-01-10 NOTE — H&P (Signed)
 Surgical History & Physical  Patient Name: John Jordan  DOB: August 28, 1960  Surgery: Cataract extraction with intraocular lens implant phacoemulsification; Left Eye Surgeon: Marsa Cleverly MD Surgery Date: 01/17/2024 Pre-Op Date: 01/03/2024  HPI: A 23 Yr. old male patient here today for a 4 day post operative exam s/p CE IOL OD (DOS:12/30/23) and a pre-operative CE IOL exam OS. Pt states vision has improved since surgery. Pt is having a little gritty sensation in OD. Pt is compliant using Imprimis drops TID OD. Pt has great difficulty reading small print on books, bottles and labels with OS. Pt also has great difficulty with glare on bright sunny days, along with difficulty driving at night due to rings, glare, and halos OS. Pt complains of overall hazy, blurry vision OS. HPI was performed by Marsa Cleverly .  Medical History: Cataracts  Arthritis High Blood Pressure LDL  Review of Systems Cardiovascular High Blood Pressure, stable. Endocrine high cholesterol Eyes cataract Musculoskeletal arthritis All recorded systems are negative except as noted above.  Social Never smoked   Medication Prednisolone-moxiflox-bromfen,  Lisinopril , Rosuvastatin, Pantoprazole , Hydrochlorothiazide, Baclofen , Pregabalin, Hydroxyzine hcl, Oxycodone   Sx/Procedures Phaco c IOL OD  Drug Allergies  Sulfa  History & Physical: Heent: cataract NECK: supple without bruits LUNGS: lungs clear to auscultation CV: regular rate and rhythm Abdomen: soft and non-tender  Impression & Plan: Assessment: 1.  CATARACT NUCLEAR SCLEROSIS AGE RELATED; Left Eye (H25.12) 2.  CATARACT EXTRACTION STATUS; Right Eye (Z98.41)  Plan: 1.  Cataracts are visually significant and account for the patient's complaints. Discussed all risks, benefits, procedures and recovery, including infection, loss of vision and eye, need for glasses after surgery or additional procedures. Patient understands changing glasses will not  improve vision. Patient indicated understanding of procedure. All questions answered. Patient desires to have surgery, recommend phacoemulsification with intraocular lens. Patient to have preliminary testing necessary (Argos/IOL Master, Mac OCT, TOPO) Educational materials provided: Cataract.  Plan: - Proceed with cataract surgery OS when ready - Plan for best distance target with DIB00 - No DM, no fuchs, no prior eye surgery - good dilation - Dextenza if available  2.  POD4. Doing well. All post-op precautions discussed and instructions reviewed. Written instructions given

## 2024-01-12 ENCOUNTER — Encounter (HOSPITAL_COMMUNITY)
Admission: RE | Admit: 2024-01-12 | Discharge: 2024-01-12 | Disposition: A | Discharge status: Home / Self Care | Source: Ambulatory Visit | Attending: Optometry | Admitting: Optometry

## 2024-01-17 ENCOUNTER — Ambulatory Visit (HOSPITAL_COMMUNITY)
Admission: RE | Admit: 2024-01-17 | Discharge: 2024-01-17 | Disposition: A | Discharge status: Home / Self Care | Attending: Optometry | Admitting: Optometry

## 2024-01-17 ENCOUNTER — Ambulatory Visit (HOSPITAL_COMMUNITY): Admitting: Anesthesiology

## 2024-01-17 ENCOUNTER — Encounter (HOSPITAL_COMMUNITY)
Admission: RE | Disposition: A | Discharge status: Home / Self Care | Payer: Self-pay | Source: Home / Self Care | Attending: Optometry

## 2024-01-17 ENCOUNTER — Other Ambulatory Visit: Payer: Self-pay

## 2024-01-17 DIAGNOSIS — F17200 Nicotine dependence, unspecified, uncomplicated: Secondary | ICD-10-CM

## 2024-01-17 DIAGNOSIS — N1831 Chronic kidney disease, stage 3a: Secondary | ICD-10-CM | POA: Diagnosis not present

## 2024-01-17 DIAGNOSIS — F419 Anxiety disorder, unspecified: Secondary | ICD-10-CM | POA: Diagnosis not present

## 2024-01-17 DIAGNOSIS — H2512 Age-related nuclear cataract, left eye: Secondary | ICD-10-CM

## 2024-01-17 DIAGNOSIS — F32A Depression, unspecified: Secondary | ICD-10-CM | POA: Diagnosis not present

## 2024-01-17 DIAGNOSIS — K219 Gastro-esophageal reflux disease without esophagitis: Secondary | ICD-10-CM | POA: Diagnosis not present

## 2024-01-17 DIAGNOSIS — F17210 Nicotine dependence, cigarettes, uncomplicated: Secondary | ICD-10-CM | POA: Diagnosis not present

## 2024-01-17 DIAGNOSIS — I129 Hypertensive chronic kidney disease with stage 1 through stage 4 chronic kidney disease, or unspecified chronic kidney disease: Secondary | ICD-10-CM

## 2024-01-17 HISTORY — PX: CATARACT EXTRACTION W/PHACO: SHX586

## 2024-01-17 SURGERY — PHACOEMULSIFICATION, CATARACT, WITH IOL INSERTION
Anesthesia: Monitor Anesthesia Care | Site: Eye | Laterality: Left

## 2024-01-17 MED ORDER — FENTANYL CITRATE (PF) 100 MCG/2ML IJ SOLN
INTRAMUSCULAR | Status: DC | PRN
  Administered 2024-01-17: 50 ug via INTRAVENOUS

## 2024-01-17 MED ORDER — TROPICAMIDE 1 % OP SOLN
1.0000 [drp] | OPHTHALMIC | Status: AC | PRN
  Administered 2024-01-17 (×3): 1 [drp] via OPHTHALMIC

## 2024-01-17 MED ORDER — PHENYLEPHRINE HCL 2.5 % OP SOLN
1.0000 [drp] | OPHTHALMIC | Status: AC | PRN
  Administered 2024-01-17 (×3): 1 [drp] via OPHTHALMIC

## 2024-01-17 MED ORDER — MOXIFLOXACIN HCL 5 MG/ML IO SOLN
INTRAOCULAR | Status: DC | PRN
Start: 2024-01-17 — End: 2024-01-17
  Administered 2024-01-17: .2 mL via INTRACAMERAL

## 2024-01-17 MED ORDER — MIDAZOLAM HCL 2 MG/2ML IJ SOLN
INTRAMUSCULAR | Status: AC
  Filled 2024-01-17: qty 2

## 2024-01-17 MED ORDER — LIDOCAINE HCL 3.5 % OP GEL
1.0000 | Freq: Once | OPHTHALMIC | Status: AC
  Administered 2024-01-17: 1 via OPHTHALMIC

## 2024-01-17 MED ORDER — SIGHTPATH DOSE#1 NA HYALUR & NA CHOND-NA HYALUR IO KIT
PACK | INTRAOCULAR | Status: DC | PRN
  Administered 2024-01-17: 1 via OPHTHALMIC

## 2024-01-17 MED ORDER — TETRACAINE HCL 0.5 % OP SOLN
1.0000 [drp] | OPHTHALMIC | Status: AC | PRN
  Administered 2024-01-17 (×3): 1 [drp] via OPHTHALMIC

## 2024-01-17 MED ORDER — MIDAZOLAM HCL 2 MG/2ML IJ SOLN
INTRAMUSCULAR | Status: DC | PRN
  Administered 2024-01-17: 2 mg via INTRAVENOUS

## 2024-01-17 MED ORDER — BSS IO SOLN
INTRAOCULAR | Status: DC | PRN
Start: 2024-01-17 — End: 2024-01-17
  Administered 2024-01-17: 15 mL via INTRAOCULAR

## 2024-01-17 MED ORDER — LIDOCAINE HCL (PF) 1 % IJ SOLN
INTRAMUSCULAR | Status: DC | PRN
  Administered 2024-01-17: 1 mL

## 2024-01-17 MED ORDER — FENTANYL CITRATE (PF) 100 MCG/2ML IJ SOLN
INTRAMUSCULAR | Status: AC
  Filled 2024-01-17: qty 2

## 2024-01-17 MED ORDER — SODIUM CHLORIDE 0.9% FLUSH
INTRAVENOUS | Status: DC | PRN
  Administered 2024-01-17: 8 mL via INTRAVENOUS

## 2024-01-17 MED ORDER — PHENYLEPHRINE-KETOROLAC 1-0.3 % IO SOLN
INTRAOCULAR | Status: DC | PRN
  Administered 2024-01-17: 500 mL via OPHTHALMIC

## 2024-01-17 MED ORDER — STERILE WATER FOR IRRIGATION IR SOLN
Status: DC | PRN
  Administered 2024-01-17: 250 mL

## 2024-01-17 MED ORDER — LACTATED RINGERS IV SOLN: INTRAVENOUS | Status: DC

## 2024-01-17 MED ORDER — POVIDONE-IODINE 5 % OP SOLN
OPHTHALMIC | Status: DC | PRN
Start: 2024-01-17 — End: 2024-01-17
  Administered 2024-01-17: 1 via OPHTHALMIC

## 2024-01-17 SURGICAL SUPPLY — 14 items
CATARACT SUITE SIGHTPATH (MISCELLANEOUS) ×1 IMPLANT
CLOTH BEACON ORANGE TIMEOUT ST (SAFETY) ×1 IMPLANT
DRSG TEGADERM 4X4.75 (GAUZE/BANDAGES/DRESSINGS) ×1 IMPLANT
EYE SHIELD UNIVERSAL CLEAR (GAUZE/BANDAGES/DRESSINGS) IMPLANT
FEE CATARACT SUITE SIGHTPATH (MISCELLANEOUS) ×1 IMPLANT
GLOVE BIOGEL PI IND STRL 7.0 (GLOVE) ×2 IMPLANT
LENS IOL TECNIS EYHANCE 20.5 (Intraocular Lens) IMPLANT
NDL HYPO 18GX1.5 BLUNT FILL (NEEDLE) ×1 IMPLANT
NEEDLE HYPO 18GX1.5 BLUNT FILL (NEEDLE) ×1 IMPLANT
PAD ARMBOARD POSITIONER FOAM (MISCELLANEOUS) ×1 IMPLANT
POSITIONER HEAD 8X9X4 ADT (SOFTGOODS) ×1 IMPLANT
SYR TB 1ML LL NO SAFETY (SYRINGE) ×1 IMPLANT
TAPE SURG TRANSPORE 1 IN (GAUZE/BANDAGES/DRESSINGS) IMPLANT
WATER STERILE IRR 250ML POUR (IV SOLUTION) ×1 IMPLANT

## 2024-01-17 NOTE — Anesthesia Preprocedure Evaluation (Signed)
 Anesthesia Evaluation  Patient identified by MRN, date of birth, ID band Patient awake    Reviewed: Allergy & Precautions, H&P , NPO status , Patient's Chart, lab work & pertinent test results, reviewed documented beta blocker date and time   Airway Mallampati: II  TM Distance: >3 FB Neck ROM: full    Dental no notable dental hx. (+) Dental Advisory Given, Teeth Intact   Pulmonary neg pulmonary ROS, Current Smoker   Pulmonary exam normal breath sounds clear to auscultation       Cardiovascular Exercise Tolerance: Good hypertension, Normal cardiovascular exam Rhythm:regular Rate:Normal     Neuro/Psych  PSYCHIATRIC DISORDERS Anxiety Depression    Spinal stenosis  Neuromuscular disease    GI/Hepatic negative GI ROS, Neg liver ROS,GERD  ,,  Endo/Other  prediabetes  Renal/GU Renal diseaseStage 3a CKD  negative genitourinary   Musculoskeletal  (+) Arthritis , Osteoarthritis,    Abdominal   Peds  Hematology  (+) Blood dyscrasia, anemia   Anesthesia Other Findings   Reproductive/Obstetrics negative OB ROS                              Anesthesia Physical Anesthesia Plan  ASA: 3  Anesthesia Plan: MAC   Post-op Pain Management: Minimal or no pain anticipated   Induction:   PONV Risk Score and Plan:   Airway Management Planned: Nasal Cannula and Natural Airway  Additional Equipment: None  Intra-op Plan:   Post-operative Plan:   Informed Consent: I have reviewed the patients History and Physical, chart, labs and discussed the procedure including the risks, benefits and alternatives for the proposed anesthesia with the patient or authorized representative who has indicated his/her understanding and acceptance.     Dental Advisory Given  Plan Discussed with: CRNA  Anesthesia Plan Comments:         Anesthesia Quick Evaluation

## 2024-01-17 NOTE — Interval H&P Note (Signed)
 History and Physical Interval Note:  01/17/2024 8:56 AM  The H and P was reviewed and updated. The patient was examined.  No changes were found after exam.  The surgical eye was marked.  John Jordan

## 2024-01-17 NOTE — Discharge Instructions (Signed)
 Please discharge patient when stable, will follow up today with Dr. Ilsa Iha at the [facility] office immediately following discharge.  Leave shield in place until visit.  All paperwork with discharge instructions will be given at the office.  [facility] Address:  [address]  [place], [place] [address]  Dr. Chaya Jan Phone: [phone]

## 2024-01-17 NOTE — Op Note (Signed)
 Date of procedure: 01/17/24  Pre-operative diagnosis: Visually significant age-related nuclear cataract, Left Eye (H25.12)  Post-operative diagnosis: Visually significant age-related nuclear cataract, Left Eye H25.12  Procedure: Removal of cataract via phacoemulsification and insertion of intra-ocular lens J&J DIB00 +20.5D into the capsular bag of the Left Eye  Attending surgeon: Marsa JINNY Cleverly, MD  Anesthesia: MAC, Topical Akten   Complications: None  Estimated Blood Loss: <73mL (minimal)  Specimens: None  Implants:  Implant Name Type Inv. Item Serial No. Manufacturer Lot No. LRB No. Used Action  LENS IOL TECNIS EYHANCE 20.5 - [id number] Intraocular Lens LENS IOL TECNIS EYHANCE 20.5 [id number] [employer]  Left 1 Implanted    Indications:  Visually significant age-related cataract, Left Eye  Procedure:  The patient was seen and identified in the pre-operative area. The operative eye was identified and dilated.  The operative eye was marked.  Topical anesthesia was administered to the operative eye.     The patient was then to the operative suite and placed in the supine position.  A timeout was performed confirming the patient, procedure to be performed, and all other relevant information.   The patient's face was prepped and draped in the usual fashion for intra-ocular surgery.  A lid speculum was placed into the operative eye and the surgical microscope moved into place and focused.  An inferotemporal paracentesis was created using a 20 gauge paracentesis blade.  BSS mixed with [place] , followed by 1% lidocaine  was injected into the anterior chamber.  Viscoelastic was injected into the anterior chamber.  A temporal clear-corneal main wound incision was created using a 2.59mm microkeratome.  A continuous curvilinear capsulorrhexis was initiated using an irrigating cystitome and completed using capsulorrhexis forceps.  Hydrodissection and hydrodeliniation were performed.  Viscoelastic  was injected into the anterior chamber.  A phacoemulsification handpiece and a chopper as a second instrument were used to remove the nucleus and epinucleus. The irrigation/aspiration handpiece was used to remove any remaining cortical material.   The capsular bag was reinflated with viscoelastic, checked, and found to be intact.  The intraocular lens was inserted into the capsular bag.  The irrigation/aspiration handpiece was used to remove any remaining viscoelastic.  The clear corneal wound and paracentesis wounds were then hydrated and checked with Weck-Cels to be watertight. Moxifloxacin  was instilled into the anterior chamber.  The lid-speculum and drape were removed. The patient's face was cleaned with a wet and dry 4x4.  A clear shield was taped over the eye. The patient was taken to the post-operative care unit in good condition, having tolerated the procedure well.  Post-Op Instructions: The patient will follow up at [facility] for a same day post-operative evaluation and will receive all other orders and instructions.

## 2024-01-17 NOTE — Transfer of Care (Signed)
 Immediate Anesthesia Transfer of Care Note  Patient: John Jordan  Procedure(s) Performed: PHACOEMULSIFICATION, CATARACT, WITH IOL INSERTION (Left: Eye)  Patient Location: Short Stay  Anesthesia Type:MAC  Level of Consciousness: awake, alert , and oriented  Airway & Oxygen Therapy: Patient Spontanous Breathing  Post-op Assessment: Report given to RN and Post -op Vital signs reviewed and stable  Post vital signs: Reviewed and stable  Last Vitals:  Vitals Value Taken Time  BP 119/76   Temp    Pulse 66   Resp 14   SpO2 96%     Last Pain:  Vitals:   01/17/24 0842  TempSrc: Oral  PainSc: 0-No pain         Complications: No notable events documented.

## 2024-01-17 NOTE — Anesthesia Postprocedure Evaluation (Signed)
 Anesthesia Post Note  Patient: John Jordan  Procedure(s) Performed: PHACOEMULSIFICATION, CATARACT, WITH IOL INSERTION (Left: Eye)  Patient location during evaluation: Phase II Anesthesia Type: MAC Level of consciousness: awake and alert Pain management: pain level controlled Vital Signs Assessment: post-procedure vital signs reviewed and stable Respiratory status: spontaneous breathing, nonlabored ventilation and respiratory function stable Cardiovascular status: stable and blood pressure returned to baseline Postop Assessment: no apparent nausea or vomiting Anesthetic complications: no   There were no known notable events for this encounter.   Last Vitals:  Vitals:   01/17/24 0842 01/17/24 0953  BP: 119/70 119/76  Pulse: 61 66  Resp: 10 15  Temp: 36.6 C   SpO2: 98% 95%    Last Pain:  Vitals:   01/17/24 0953  TempSrc:   PainSc: 5                  Andrey Hoobler L Ryan Ogborn

## 2024-01-18 ENCOUNTER — Encounter (HOSPITAL_COMMUNITY): Payer: Self-pay | Admitting: Optometry
